# Patient Record
Sex: Female | Born: 2000 | Race: White | Hispanic: No | Marital: Single | Smoking: Never smoker
Health system: Southern US, Community
[De-identification: ages and names within clinical notes are randomized; demographics above are authoritative.]

## PROBLEM LIST (undated history)

## (undated) DIAGNOSIS — F909 Attention-deficit hyperactivity disorder, unspecified type: Secondary | ICD-10-CM

## (undated) DIAGNOSIS — J45909 Unspecified asthma, uncomplicated: Secondary | ICD-10-CM

## (undated) DIAGNOSIS — F32A Depression, unspecified: Secondary | ICD-10-CM

## (undated) DIAGNOSIS — F99 Mental disorder, not otherwise specified: Secondary | ICD-10-CM

## (undated) DIAGNOSIS — R519 Headache, unspecified: Secondary | ICD-10-CM

## (undated) HISTORY — DX: Depression, unspecified: F32.A

## (undated) HISTORY — DX: Attention-deficit hyperactivity disorder, unspecified type: F90.9

## (undated) HISTORY — DX: Headache, unspecified: R51.9

## (undated) HISTORY — PX: TONSILLECTOMY: SUR1361

## (undated) HISTORY — DX: Mental disorder, not otherwise specified: F99

## (undated) HISTORY — PX: ADENOIDECTOMY: SUR15

---
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
## 2018-01-01 MED FILL — TOPIRAMATE 50 MG TABLET: 50 | 30 days supply | Qty: 60 | Fill #0

## 2018-01-01 MED FILL — SULFAMETHOXAZOLE-TMP DS TAB: 800-160 | 5 days supply | Qty: 10 | Fill #0

## 2018-01-07 MED FILL — PROMETHAZINE 25 MG TABLET: 25 | 4 days supply | Qty: 20 | Fill #0

## 2018-01-08 MED FILL — DICYCLOMINE 10 MG CAPSULE: 10 | 14 days supply | Qty: 56 | Fill #0

## 2018-02-03 MED FILL — ADDERALL XR 30 MG CAP SA: 30 | 30 days supply | Qty: 30 | Fill #0

## 2018-02-03 MED FILL — TOPIRAMATE 50 MG TABLET: 50 | 30 days supply | Qty: 60 | Fill #1

## 2018-02-06 MED FILL — SUMATRIPTAN SUCC 100 MG TAB: 100 | 30 days supply | Qty: 9 | Fill #0

## 2018-02-20 IMAGING — DX DG ANKLE COMPLETE 3+V*R*
3 series · 3 of 3 positions shown · non-contrast
Comparison: None.

CLINICAL DATA: Pain after injury. Bilateral foot and ankle and left
lower leg pain after dropping a cooler onto both ankles.

EXAM:
RIGHT ANKLE - COMPLETE 3+ VIEW

[ankle ap]
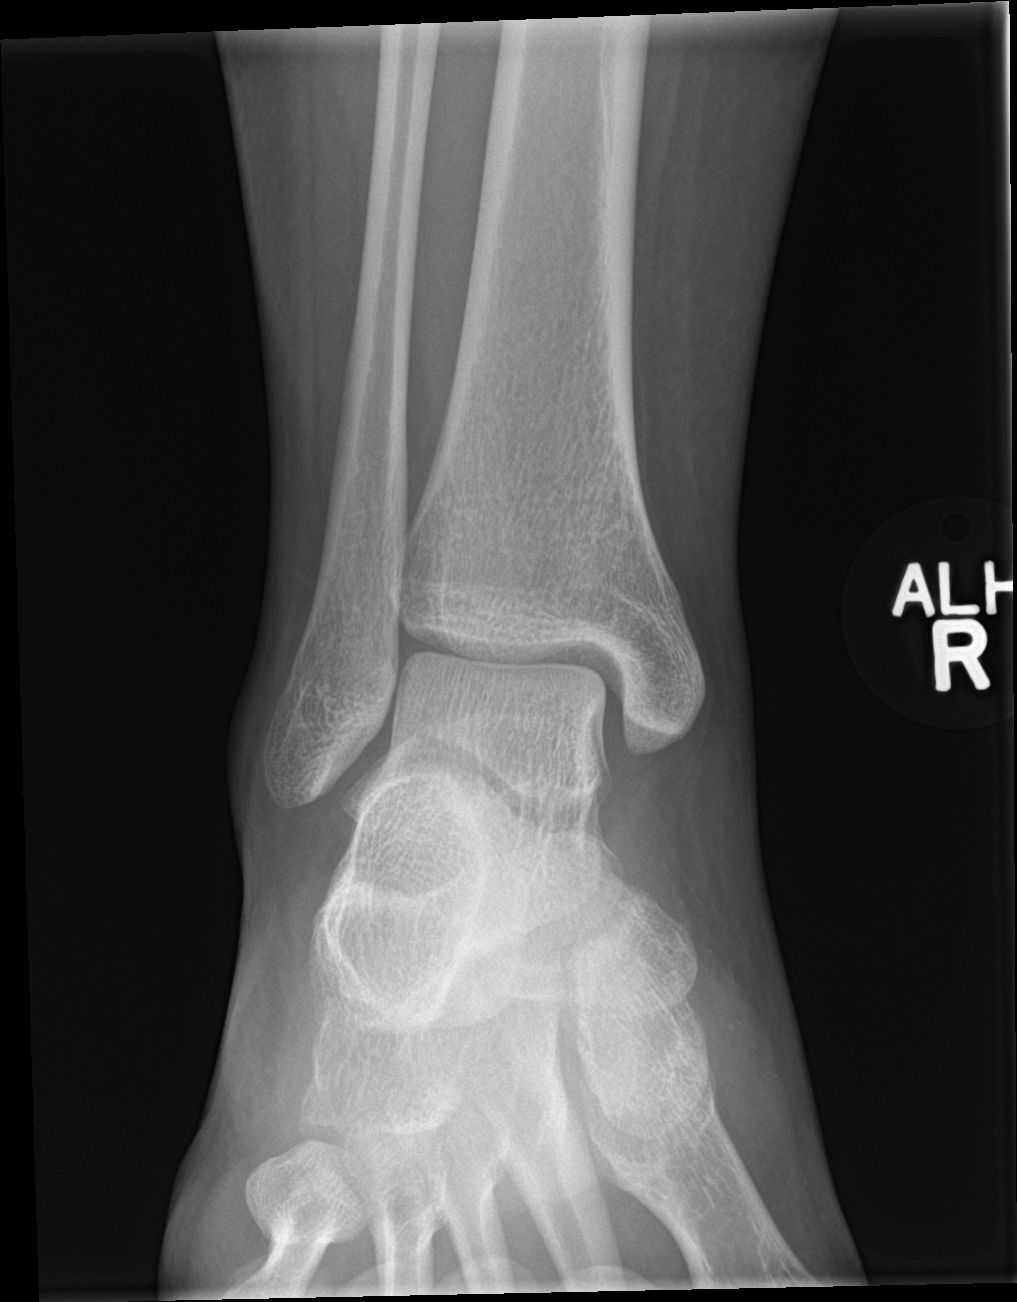

[ankle obl]
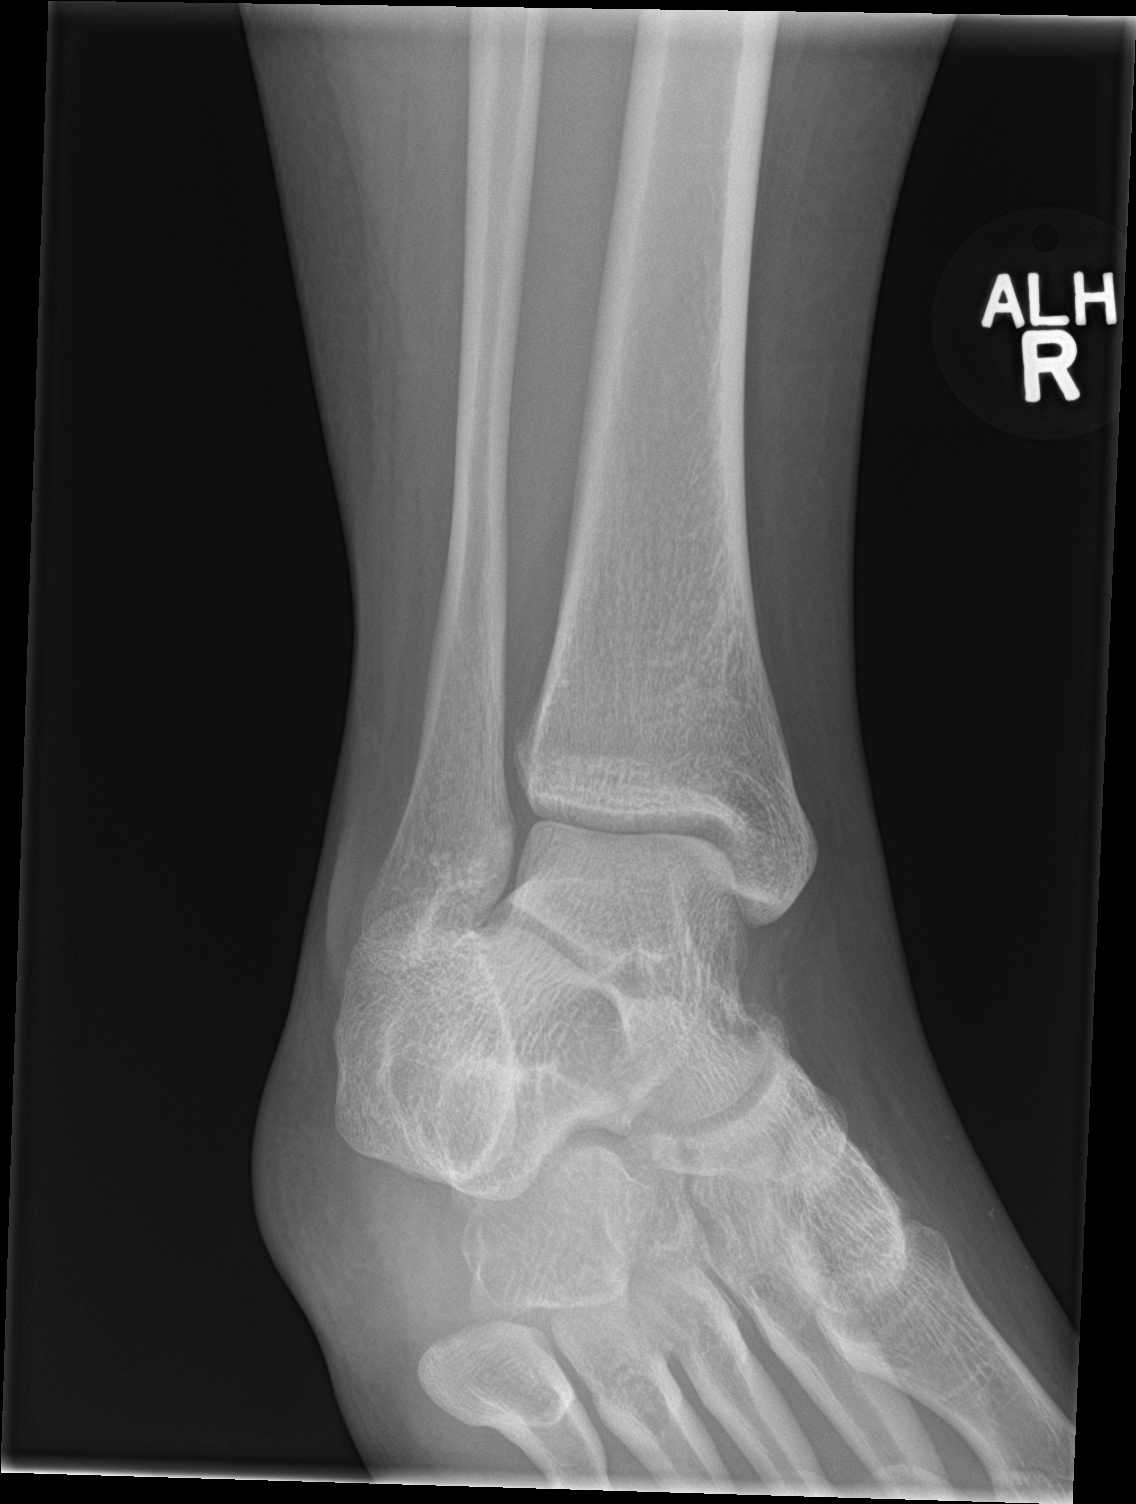

[ankle lat]
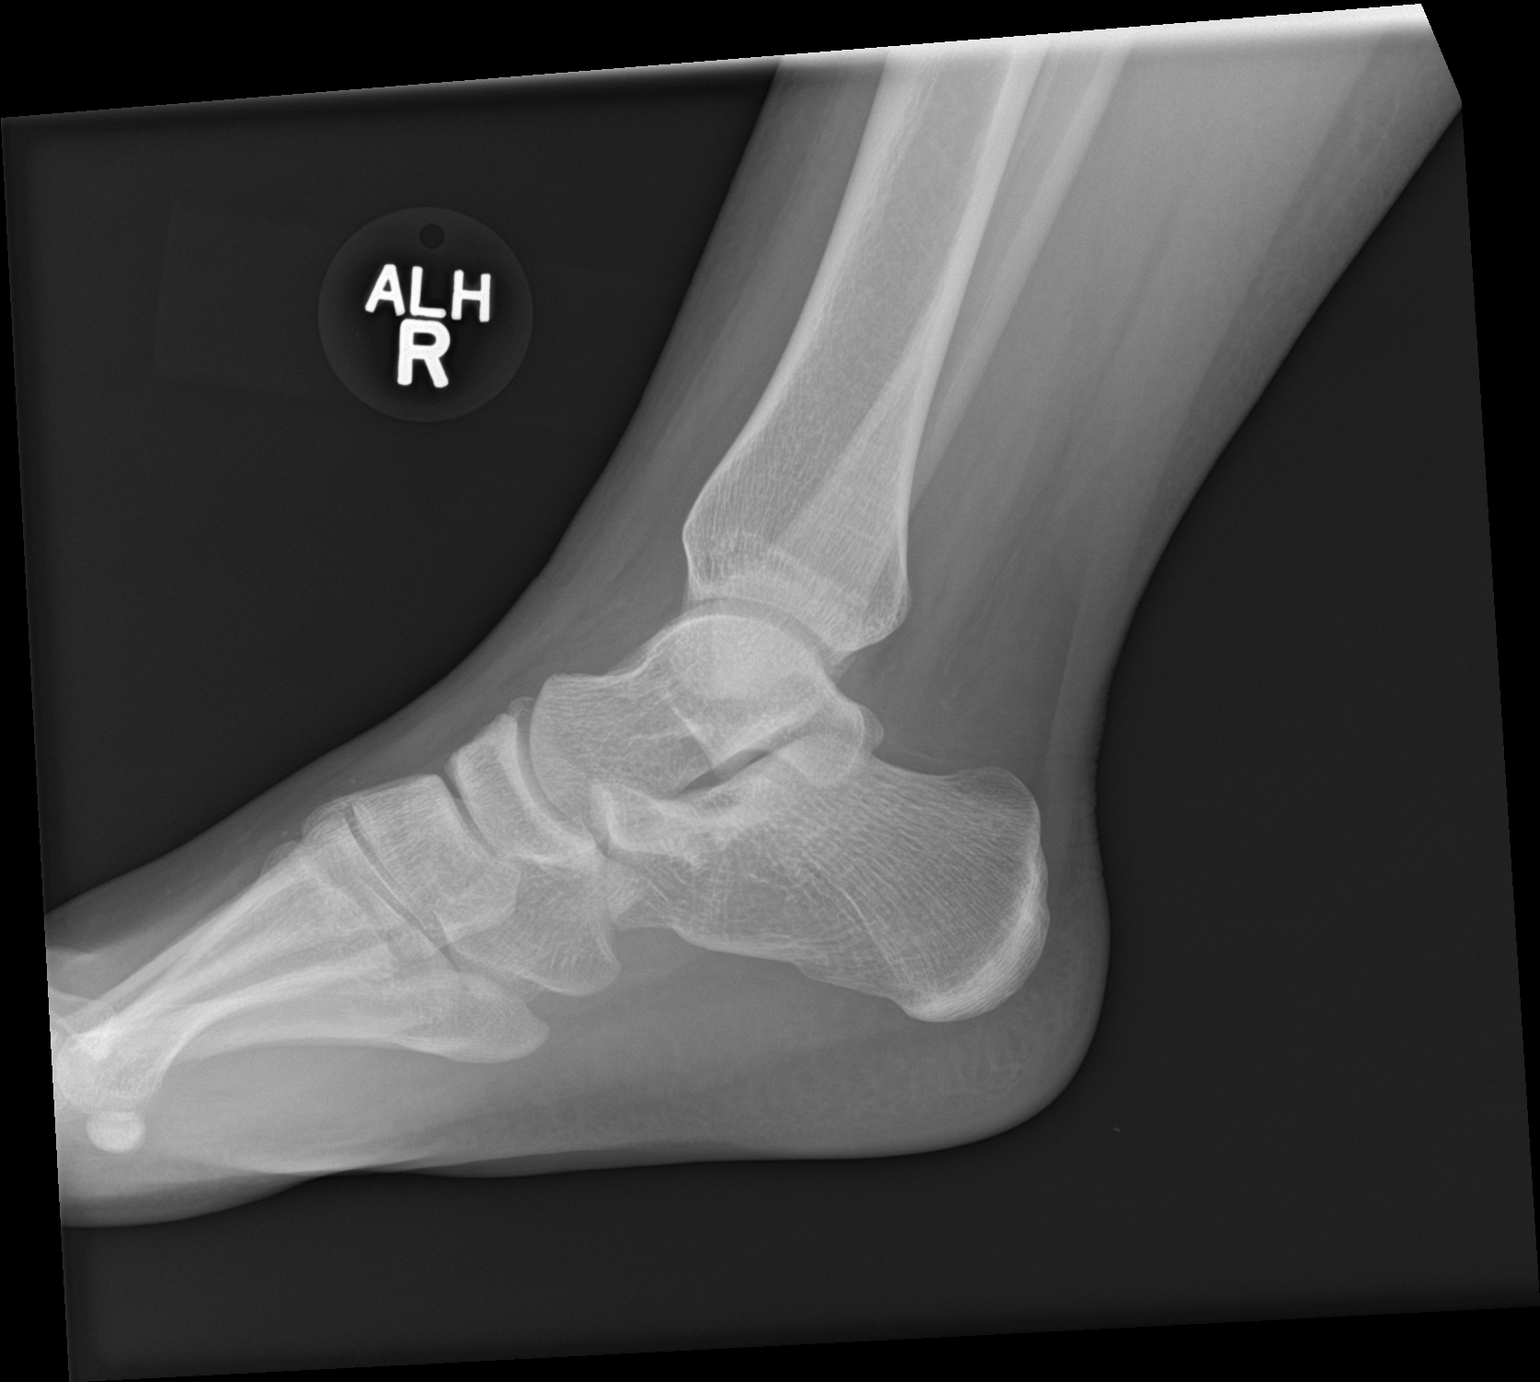

[3 of 3 positions shown; findings below may reference images not displayed]

FINDINGS: There is no evidence of fracture, dislocation, or joint effusion.
There is no evidence of arthropathy or other focal bone abnormality.
Soft tissues are unremarkable.
IMPRESSION: Negative radiographs of the right ankle.

## 2018-02-20 IMAGING — DX DG FOOT COMPLETE 3+V*L*
3 series · 3 of 3 positions shown · non-contrast
Comparison: None.

CLINICAL DATA: Pain after injury. Bilateral foot and ankle and left
lower leg pain after dropping a cooler onto both ankles.

EXAM:
LEFT FOOT - COMPLETE 3+ VIEW

[foot ap]
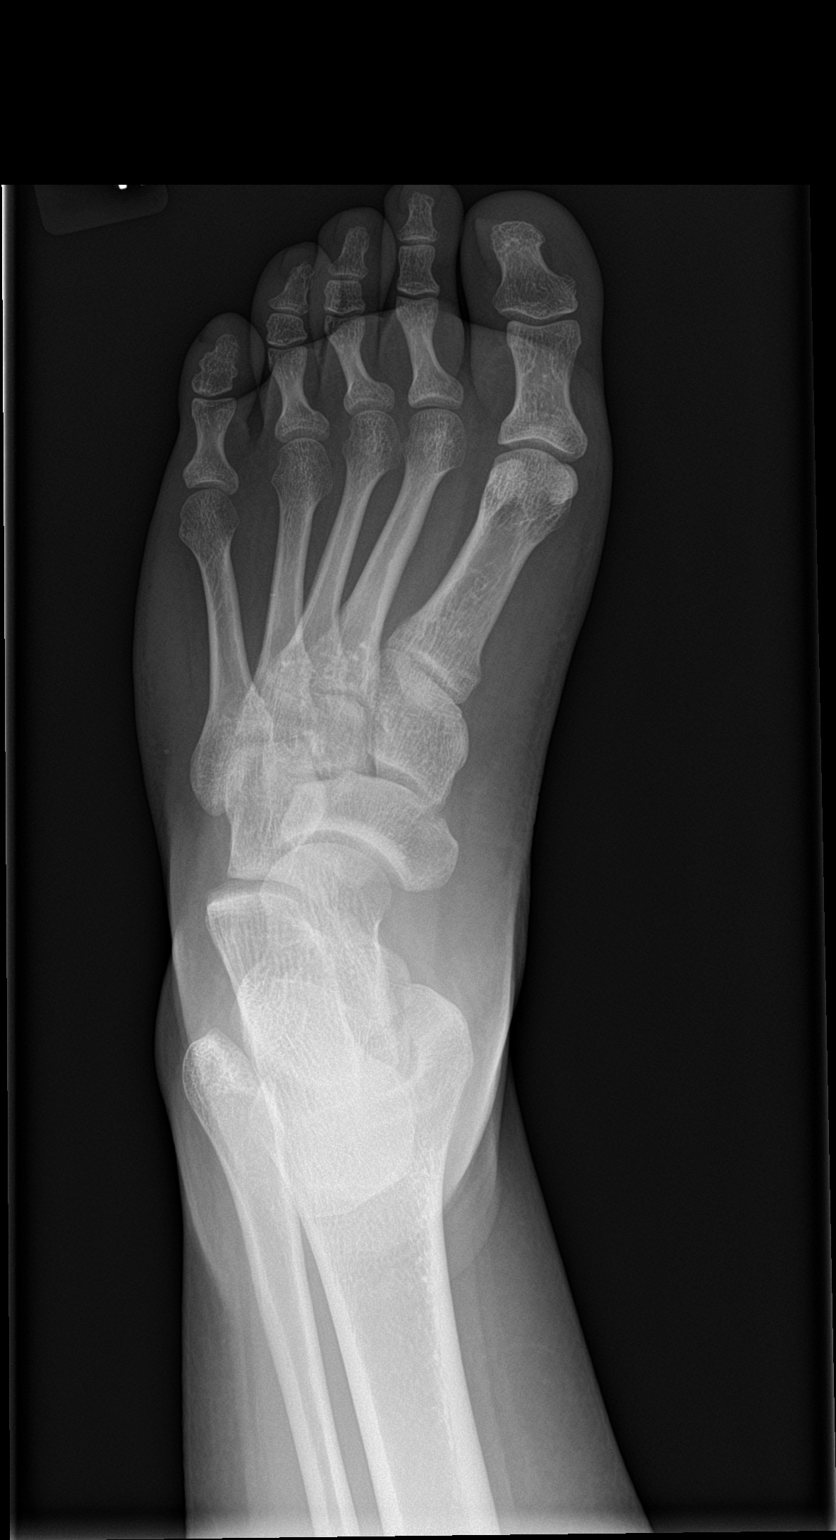

[foot obl]
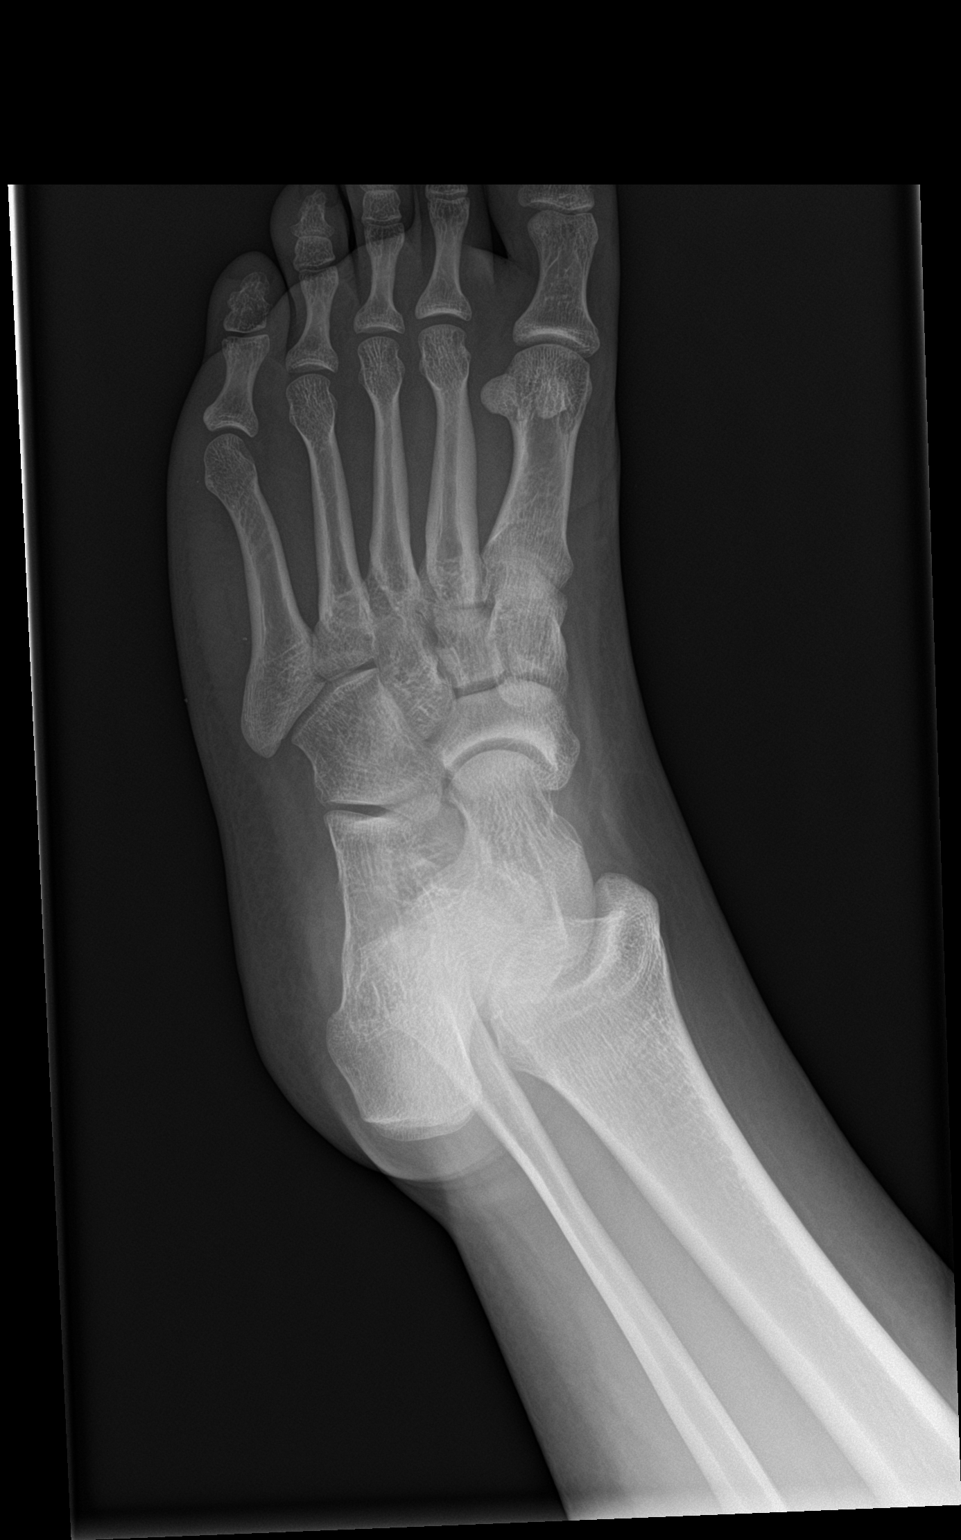

[foot lat]
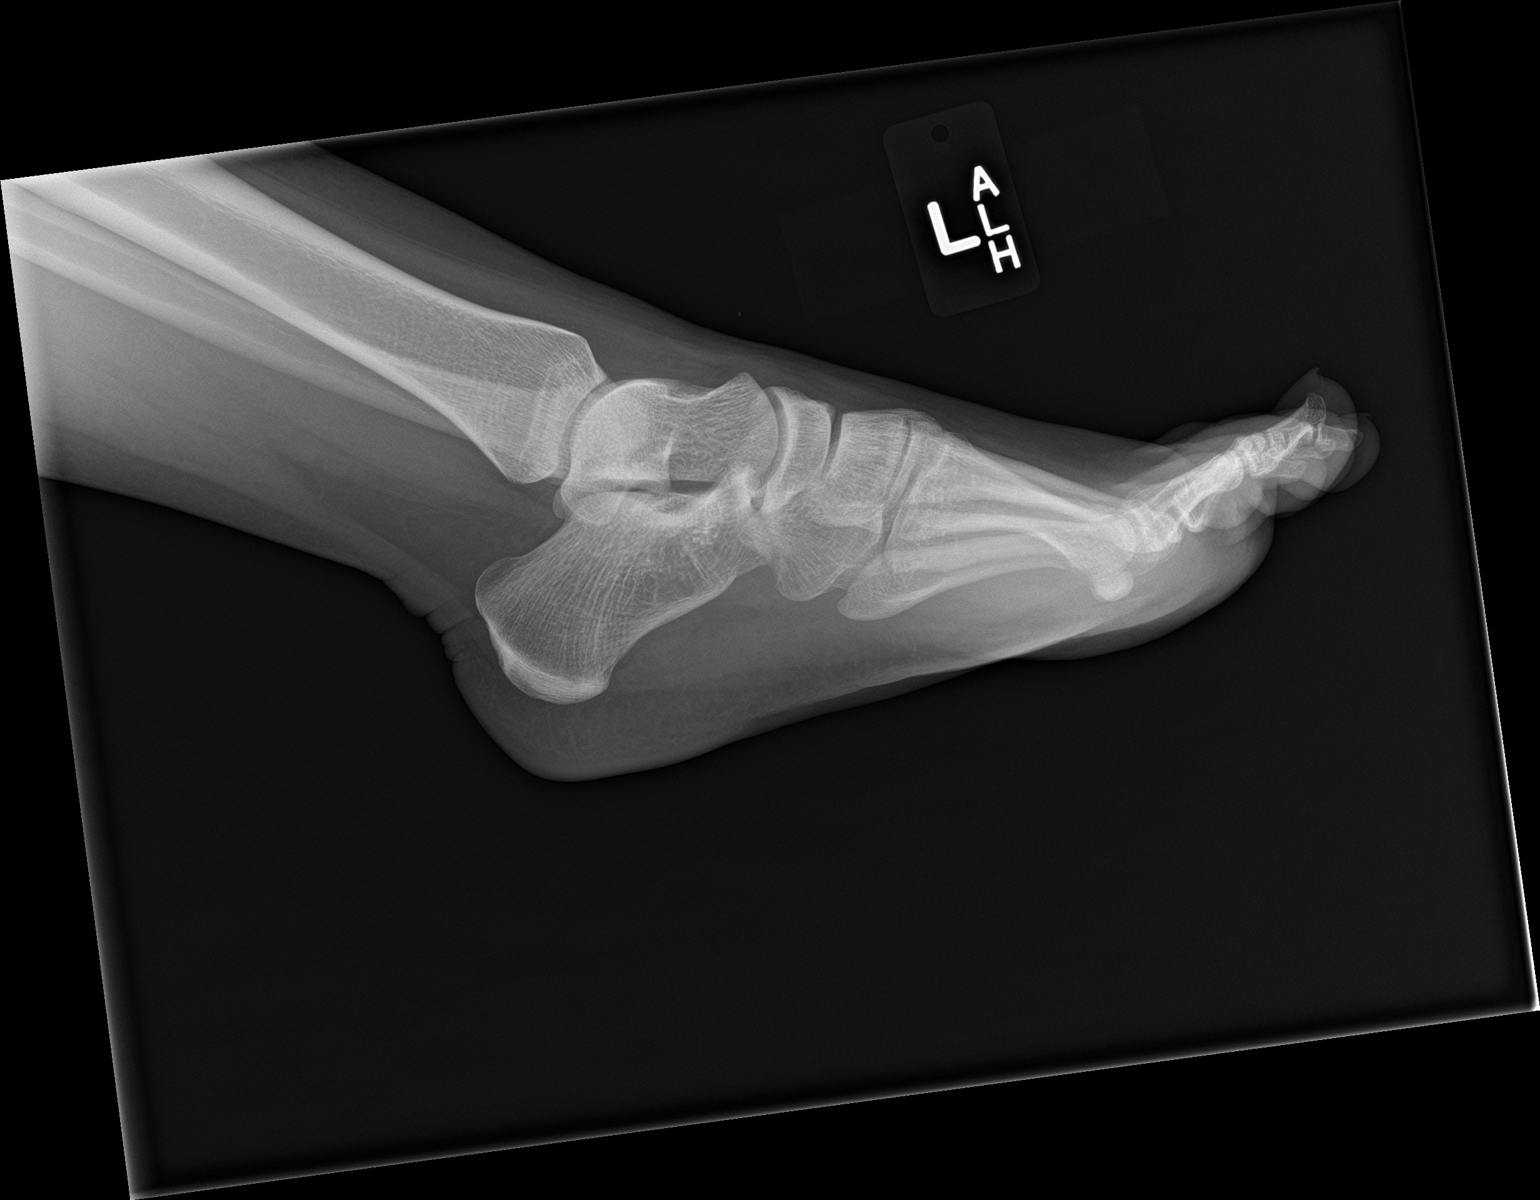

[3 of 3 positions shown; findings below may reference images not displayed]

FINDINGS: There is no evidence of fracture or dislocation. There is no
evidence of arthropathy or other focal bone abnormality. Medial soft
tissue edema.
IMPRESSION: Soft tissue edema.  No acute osseous abnormality.

## 2018-03-06 MED FILL — ADDERALL XR 30 MG CAP SA: 30 | 30 days supply | Qty: 30 | Fill #0

## 2018-03-06 MED FILL — SUMATRIPTAN SUCC 100 MG TAB: 100 | 30 days supply | Qty: 9 | Fill #1

## 2018-03-06 MED FILL — TOPIRAMATE 50 MG TABLET: 50 | 30 days supply | Qty: 60 | Fill #2

## 2018-04-15 MED FILL — ADDERALL XR 30 MG CAP SA: 30 | 30 days supply | Qty: 30 | Fill #0

## 2018-04-15 MED FILL — SUMAtriptan SUCCINATE 100 M: 100 | 30 days supply | Qty: 9 | Fill #2

## 2018-04-18 MED FILL — TOPIRAMATE 50 MG TABLET: 50 | 30 days supply | Qty: 60 | Fill #0

## 2018-05-27 DIAGNOSIS — A084 Viral intestinal infection, unspecified: Secondary | ICD-10-CM | POA: Diagnosis not present

## 2018-06-06 MED FILL — TOPIRAMATE 50 MG TABLET: 50 | 30 days supply | Qty: 60 | Fill #1

## 2018-06-09 MED FILL — SUMAtriptan SUCCINATE 100 M: 100 | 30 days supply | Qty: 9 | Fill #0

## 2018-06-09 MED FILL — ADDERALL XR 30 MG CAP SA: 30 | 30 days supply | Qty: 30 | Fill #0

## 2018-07-11 DIAGNOSIS — R350 Frequency of micturition: Secondary | ICD-10-CM | POA: Diagnosis not present

## 2018-07-11 DIAGNOSIS — R300 Dysuria: Secondary | ICD-10-CM | POA: Diagnosis not present

## 2018-07-11 DIAGNOSIS — N390 Urinary tract infection, site not specified: Secondary | ICD-10-CM | POA: Diagnosis not present

## 2018-07-23 MED FILL — ADDERALL XR 30 MG CAP SA: 30 | 30 days supply | Qty: 30 | Fill #0

## 2018-08-08 DIAGNOSIS — J069 Acute upper respiratory infection, unspecified: Secondary | ICD-10-CM | POA: Diagnosis not present

## 2018-08-08 DIAGNOSIS — J029 Acute pharyngitis, unspecified: Secondary | ICD-10-CM | POA: Diagnosis not present

## 2018-08-09 DIAGNOSIS — S0990XA Unspecified injury of head, initial encounter: Secondary | ICD-10-CM | POA: Diagnosis not present

## 2018-08-09 DIAGNOSIS — S99912A Unspecified injury of left ankle, initial encounter: Secondary | ICD-10-CM | POA: Diagnosis not present

## 2018-08-09 DIAGNOSIS — M25511 Pain in right shoulder: Secondary | ICD-10-CM | POA: Diagnosis not present

## 2018-08-09 DIAGNOSIS — S40011A Contusion of right shoulder, initial encounter: Secondary | ICD-10-CM | POA: Diagnosis not present

## 2018-08-11 DIAGNOSIS — F0781 Postconcussional syndrome: Secondary | ICD-10-CM | POA: Diagnosis not present

## 2018-08-11 DIAGNOSIS — H538 Other visual disturbances: Secondary | ICD-10-CM | POA: Diagnosis not present

## 2018-08-11 DIAGNOSIS — R42 Dizziness and giddiness: Secondary | ICD-10-CM | POA: Diagnosis not present

## 2018-08-11 DIAGNOSIS — R000 Tachycardia, unspecified: Secondary | ICD-10-CM | POA: Diagnosis not present

## 2018-08-11 DIAGNOSIS — S0990XA Unspecified injury of head, initial encounter: Secondary | ICD-10-CM | POA: Diagnosis not present

## 2018-08-11 DIAGNOSIS — N390 Urinary tract infection, site not specified: Secondary | ICD-10-CM | POA: Diagnosis not present

## 2018-08-11 MED FILL — BENZONATATE 200 MG CAPS: 200 | 10 days supply | Qty: 30 | Fill #0

## 2018-08-11 MED FILL — HYDROCODONE-HOMATROPINE SYR: 5-1.5 | 24 days supply | Qty: 120 | Fill #0

## 2018-08-12 DIAGNOSIS — F0781 Postconcussional syndrome: Secondary | ICD-10-CM | POA: Diagnosis not present

## 2018-08-12 DIAGNOSIS — R000 Tachycardia, unspecified: Secondary | ICD-10-CM | POA: Diagnosis not present

## 2018-08-12 DIAGNOSIS — H538 Other visual disturbances: Secondary | ICD-10-CM | POA: Diagnosis not present

## 2018-08-12 DIAGNOSIS — R42 Dizziness and giddiness: Secondary | ICD-10-CM | POA: Diagnosis not present

## 2018-09-24 NOTE — L&D Delivery Note (Signed)
Delivery Note At 9:37 PM a viable female was delivered via Vaginal, Spontaneous (Presentation:  Left Occiput Anterior); loose nuchal x 1 reduced.  APGAR: , ; weight pending.   Placenta status: Spontaneous, Intact.  Cord: 3 vessels with the following complications: None.  Cord pH: n/a  Anesthesia: Epidural Episiotomy: Median Lacerations: 2nd degree Suture Repair: 3.0 vicryl rapide Est. Blood Loss (mL): 350  Mom to postpartum.  Baby to Couplet care / Skin to Skin.  Linda Hedges 09/02/2019, 9:59 PM

## 2018-10-02 MED FILL — TOPIRAMATE 50 MG TABLET: 50 | 30 days supply | Qty: 60 | Fill #2

## 2018-10-02 MED FILL — SUMAtriptan SUCCINATE 100 M: 100 | 30 days supply | Qty: 9 | Fill #1

## 2018-11-10 MED FILL — FLUTICASONE PROP 50 MCG SPR: 50 | 30 days supply | Qty: 16 | Fill #0

## 2018-12-09 MED FILL — TOPIRAMATE 50 MG TABLET: 50 | 30 days supply | Qty: 60 | Fill #0

## 2018-12-09 MED FILL — SUMAtriptan SUCCINATE 100 M: 100 | 30 days supply | Qty: 9 | Fill #2

## 2018-12-09 MED FILL — ADDERALL XR 30 MG CAP SA: 30 | 30 days supply | Qty: 30 | Fill #0

## 2018-12-29 MED FILL — predniSONE 20 MG TABS: 20 | 7 days supply | Qty: 10 | Fill #0

## 2019-03-03 MED FILL — PNV-SELECT TABLET: 27-0.6-0.4 | 30 days supply | Qty: 30 | Fill #0

## 2019-03-05 MED FILL — DOXYLAMINE-PYRIDOXINE 10-10: 10-10 | 15 days supply | Qty: 30 | Fill #0

## 2019-03-11 LAB — OB RESULTS CONSOLE GC/CHLAMYDIA
Chlamydia: NEGATIVE
Gonorrhea: NEGATIVE

## 2019-03-11 LAB — OB RESULTS CONSOLE RPR: RPR: NONREACTIVE

## 2019-03-11 LAB — OB RESULTS CONSOLE HIV ANTIBODY (ROUTINE TESTING): HIV: NONREACTIVE

## 2019-03-11 LAB — OB RESULTS CONSOLE HEPATITIS B SURFACE ANTIGEN: Hepatitis B Surface Ag: NEGATIVE

## 2019-03-11 LAB — OB RESULTS CONSOLE RUBELLA ANTIBODY, IGM: Rubella: IMMUNE

## 2019-03-11 LAB — OB RESULTS CONSOLE ANTIBODY SCREEN: Antibody Screen: NEGATIVE

## 2019-03-11 LAB — OB RESULTS CONSOLE ABO/RH: RH Type: NEGATIVE

## 2019-07-31 ENCOUNTER — Inpatient Hospital Stay (HOSPITAL_COMMUNITY)
Admission: AD | Admit: 2019-07-31 | Discharge: 2019-07-31 | Disposition: A | Discharge status: Home / Self Care | Payer: No Typology Code available for payment source | Attending: Obstetrics and Gynecology | Admitting: Obstetrics and Gynecology

## 2019-07-31 ENCOUNTER — Encounter (HOSPITAL_COMMUNITY): Payer: Self-pay | Admitting: *Deleted

## 2019-07-31 ENCOUNTER — Other Ambulatory Visit: Payer: Self-pay

## 2019-07-31 DIAGNOSIS — O26893 Other specified pregnancy related conditions, third trimester: Secondary | ICD-10-CM | POA: Insufficient documentation

## 2019-07-31 DIAGNOSIS — Z3A34 34 weeks gestation of pregnancy: Secondary | ICD-10-CM | POA: Diagnosis not present

## 2019-07-31 DIAGNOSIS — R102 Pelvic and perineal pain: Secondary | ICD-10-CM | POA: Insufficient documentation

## 2019-07-31 DIAGNOSIS — Z3689 Encounter for other specified antenatal screening: Secondary | ICD-10-CM

## 2019-07-31 DIAGNOSIS — O99891 Other specified diseases and conditions complicating pregnancy: Secondary | ICD-10-CM | POA: Diagnosis not present

## 2019-07-31 DIAGNOSIS — M549 Dorsalgia, unspecified: Secondary | ICD-10-CM | POA: Diagnosis not present

## 2019-07-31 LAB — URINALYSIS, ROUTINE W REFLEX MICROSCOPIC
Bilirubin Urine: NEGATIVE
Glucose, UA: NEGATIVE mg/dL
Hgb urine dipstick: NEGATIVE
Ketones, ur: NEGATIVE mg/dL
Leukocytes,Ua: NEGATIVE
Nitrite: NEGATIVE
Protein, ur: NEGATIVE mg/dL
Specific Gravity, Urine: 1.011 (ref 1.005–1.030)
pH: 6 (ref 5.0–8.0)

## 2019-07-31 LAB — WET PREP, GENITAL
Clue Cells Wet Prep HPF POC: NONE SEEN
Sperm: NONE SEEN
Trich, Wet Prep: NONE SEEN
Yeast Wet Prep HPF POC: NONE SEEN

## 2019-07-31 MED ORDER — CYCLOBENZAPRINE HCL 10 MG PO TABS: 10.0000 mg | ORAL_TABLET | Freq: Two times a day (BID) | ORAL | 0 refills | Status: DC | PRN

## 2019-07-31 MED ORDER — CYCLOBENZAPRINE HCL 10 MG PO TABS
10.0000 mg | ORAL_TABLET | Freq: Once | ORAL | Status: AC
  Administered 2019-07-31: 10 mg via ORAL
  Filled 2019-07-31: qty 1

## 2019-07-31 NOTE — MAU Provider Note (Signed)
History     CSN: [id number]  Arrival date and time: 07/31/19 1042   First Provider Initiated Contact with Patient 07/31/19 1133      Chief Complaint  Patient presents with  . Back Pain  . Pelvic Pain   Destiny Harris is a 18 y.o. G1P0 at [redacted]w[redacted]d who receives care at [facility].  She presents today for Back Pain and Pelvic Pain.  She states the back pain has been ongoing for a few days, but the pelvic pain started late last night and has got progressively worse.  She states that she has taken tylenol last night with some relief of her symptoms.  Patient rates the pain a 10/10 and is constant sharp pain and pressure.  She reports some vaginal bleeding that she noticed this morning.  She states it was a brownish red color and was noted with wiping.  She endorses fetal movement and denies LOF, but goes on to state that she felt "a little wet this morning" and was unsure of whether "I peed on myself or if it was something else."      OB History    Gravida  1   Para      Term      Preterm      AB      Living        SAB      TAB      Ectopic      Multiple      Live Births              Past Medical History:  Diagnosis Date  . Asthma     Past Surgical History:  Procedure Laterality Date  . ADENOIDECTOMY    . TONSILLECTOMY      History reviewed. No pertinent family history.  Social History   Tobacco Use  . Smoking status: Never Smoker  . Smokeless tobacco: Never Used  Substance Use Topics  . Alcohol use: No  . Drug use: No    Allergies: No Known Allergies  Medications Prior to Admission  Medication Sig Dispense Refill Last Dose  . Prenatal Vit-Fe Fumarate-FA (PRENATAL MULTIVITAMIN) TABS tablet Take 1 tablet by mouth daily at 12 noon.   07/31/2019 at Unknown time  . albuterol (PROAIR HFA) 108 (90 BASE) MCG/ACT inhaler Inhale 2 puffs into the lungs every 6 (six) hours as needed for wheezing or shortness of breath.     Marland Kitchen albuterol (PROVENTIL  HFA;VENTOLIN HFA) 108 (90 Base) MCG/ACT inhaler Inhale 2 puffs into the lungs every 6 (six) hours as needed for wheezing or shortness of breath.     Marland Kitchen albuterol (PROVENTIL) (2.5 MG/3ML) 0.083% nebulizer solution Take 2.5 mg by nebulization every 6 (six) hours as needed for wheezing or shortness of breath.     . amphetamine-dextroamphetamine (ADDERALL XR) 15 MG 24 hr capsule Take 15 mg by mouth every morning.     Marland Kitchen azelastine (ASTELIN) 0.1 % nasal spray Place 1 spray into both nostrils 2 (two) times daily. Use in each nostril as directed     . benzonatate (TESSALON) 200 MG capsule Take 200 mg by mouth 3 (three) times daily as needed for cough.     . cetirizine (ZYRTEC) 10 MG tablet Take 10 mg by mouth daily.     Marland Kitchen guaiFENesin-codeine (ROBITUSSIN AC) 100-10 MG/5ML syrup Take 5 mLs by mouth 3 (three) times daily as needed for cough.     Marland Kitchen omeprazole (PRILOSEC) 20 MG capsule  Take 2 capsules (40 mg total) by mouth 2 (two) times daily before a meal. 120 capsule 5   . predniSONE (STERAPRED UNI-PAK 21 TAB) 5 MG (21) TBPK tablet Take 5 mg by mouth daily.     . ranitidine (ZANTAC) 150 MG capsule Take 2 capsules (300 mg total) by mouth every evening. 60 capsule 5   . Respiratory Therapy Supplies (FLUTTER) DEVI 1 Device by Does not apply route as needed. 1 each 0   . sodium chloride (OCEAN) 0.65 % SOLN nasal spray Place 1 spray into both nostrils as needed for congestion.     . traMADol (ULTRAM) 50 MG tablet 1-2 every 4 hours as needed for cough or pain 20 tablet 0     Review of Systems  Constitutional: Negative for chills and fever.  Respiratory: Negative for cough and shortness of breath.   Gastrointestinal: Negative for constipation, diarrhea, nausea and vomiting.  Genitourinary: Positive for pelvic pain, vaginal bleeding (Brownish Reddish) and vaginal discharge. Negative for difficulty urinating and dysuria.  Musculoskeletal: Positive for back pain.  Neurological: Negative for dizziness,  light-headedness and headaches.   Physical Exam   Blood pressure (!) 142/70, pulse (!) 107, temperature 98 F (36.7 C), resp. rate 16, last menstrual period 12/03/2018, SpO2 100 %.  Vitals:   07/31/19 1148 07/31/19 1150 07/31/19 1155 07/31/19 1321  BP: 137/71   128/71  Pulse: 98   90  Resp:    16  Temp:      SpO2:  99% 98%     Physical Exam  Constitutional: She is oriented to person, place, and time.  HENT:  Head: Normocephalic and atraumatic.  Eyes: Conjunctivae are normal.  Neck: Normal range of motion.  Cardiovascular: Normal rate, regular rhythm and normal heart sounds.  Respiratory: Effort normal and breath sounds normal.  GI: Soft. Bowel sounds are normal. There is abdominal tenderness. There is no CVA tenderness.  Genitourinary:    Genitourinary Comments: Speculum Exam: -Normal External Genitalia: Non tender, no apparent discharge at introitus.  -Vaginal Vault: Pink mucosa with good rugae. Small amt thin white discharge -wet prep collected -Cervix:Pink, no lesions, cysts, or polyps.  Appears closed. No active bleeding from os-GC/CT collected -Bimanual Exam: Dilation: Closed Exam by:: Milinda Cave CNM    Musculoskeletal: Normal range of motion.  Neurological: She is alert and oriented to person, place, and time.  Skin: Skin is warm and dry.  Psychiatric: She has a normal mood and affect. Her behavior is normal.    Fetal Assessment 135 bpm, Mod Var, -Decels, +Accels Toco: Irritability  MAU Course   Results for orders placed or performed during the hospital encounter of 07/31/19 (from the past 24 hour(s))  Urinalysis, Routine w reflex microscopic     Status: Abnormal   Collection Time: 07/31/19 11:08 AM  Result Value Ref Range   Color, Urine YELLOW YELLOW   APPearance HAZY (A) CLEAR   Specific Gravity, Urine 1.011 1.005 - 1.030   pH 6.0 5.0 - 8.0   Glucose, UA NEGATIVE NEGATIVE mg/dL   Hgb urine dipstick NEGATIVE NEGATIVE   Bilirubin Urine NEGATIVE NEGATIVE    Ketones, ur NEGATIVE NEGATIVE mg/dL   Protein, ur NEGATIVE NEGATIVE mg/dL   Nitrite NEGATIVE NEGATIVE   Leukocytes,Ua NEGATIVE NEGATIVE  Wet prep, genital     Status: Abnormal   Collection Time: 07/31/19 11:46 AM   Specimen: PATH Cytology Cervicovaginal Ancillary Only  Result Value Ref Range   Yeast Wet Prep HPF POC NONE SEEN NONE SEEN  Trich, Wet Prep NONE SEEN NONE SEEN   Clue Cells Wet Prep HPF POC NONE SEEN NONE SEEN   WBC, Wet Prep HPF POC MANY (A) NONE SEEN   Sperm NONE SEEN    No results found.  MDM PE Labs: GC/CT and Wet Prep EFM Pain Mgmt Assessment and Plan  18 year old G1P0  SIUP at 34.2weeks Cat I FT Back Pain Pelvic Pain  -POC discussed -Exam performed and findings discussed. -Cultures collected and pending.  -Offered and accepts pain medication. -Will give flexeril 10mg  now and reassess.   Cherre RobinsJessica L Neamiah Sciarra MSN, CNM 07/31/2019, 11:34 AM    Reassessment (12:59 PM)  -Wet prep returns with insignificant findings. -Results discussed with patient. -Informed that GC/CT will return within 2-3 days. -Patient reports some relief in pain s/p flexeril. -Patient states pain is further relieved with position change. -Patient denies increased blood pressures in office. -Informed of elevated bp x1 and will defer work up since no prior history.  Discussed how primary ob may complete full preelampsia work up if additional elevations noted.  -Keep appt as scheduled for Monday. -Patient reports that she does not have a belly support belt, but wears belly bands in her pants. -Patient encouraged to obtain belly support band for work. -Discussed usage of warm compresses to back and pelvis. -Instructed to follow up with primary ob as scheduled. -Will take OOW until next ob appt on Monday November 9th -No other questions or concerns. -Encouraged to call or return to MAU if symptoms worsen or with the onset of new symptoms. -Discharged to home in stable condition.  Cherre RobinsJessica  L Devanta Daniel MSN, CNM Advanced Practice Provider, [facility]

## 2019-07-31 NOTE — MAU Note (Signed)
.   Destiny Harris is a 18 y.o. at [redacted]w[redacted]d here in MAU reporting: lower back pain that is ongoing, with pelvic pain that started last night. Brownish red discharge when she wiped this morning  Onset of complaint: ongoing Pain score: 10/10 Vitals:   07/31/19 1105  BP: (!) 142/70  Pulse: (!) 107  Resp: 16  Temp: 98 F (36.7 C)  SpO2: 100%     FHT:148 Lab orders placed from triage: [facility]

## 2019-07-31 NOTE — Discharge Instructions (Signed)
Abdominal Pain During Pregnancy  Abdominal pain is common during pregnancy, and has many possible causes. Some causes are more serious than others, and sometimes the cause is not known. Abdominal pain can be a sign that labor is starting. It can also be caused by normal growth and stretching of muscles and ligaments during pregnancy. Always tell your health care provider if you have any abdominal pain. Follow these instructions at home:  Do not have sex or put anything in your vagina until your pain goes away completely.  Get plenty of rest until your pain improves.  Drink enough fluid to keep your urine pale yellow.  Take over-the-counter and prescription medicines only as told by your health care provider.  Keep all follow-up visits as told by your health care provider. This is important. Contact a health care provider if:  Your pain continues or gets worse after resting.  You have lower abdominal pain that: ? Comes and goes at regular intervals. ? Spreads to your back. ? Is similar to menstrual cramps.  You have pain or burning when you urinate. Get help right away if:  You have a fever or chills.  You have vaginal bleeding.  You are leaking fluid from your vagina.  You are passing tissue from your vagina.  You have vomiting or diarrhea that lasts for more than 24 hours.  Your baby is moving less than usual.  You feel very weak or faint.  You have shortness of breath.  You develop severe pain in your upper abdomen. Summary  Abdominal pain is common during pregnancy, and has many possible causes.  If you experience abdominal pain during pregnancy, tell your health care provider right away.  Follow your health care provider's home care instructions and keep all follow-up visits as directed. This information is not intended to replace advice given to you by your health care provider. Make sure you discuss any questions you have with your health care  provider. Document Released: 09/10/2005 Document Revised: 12/29/2018 Document Reviewed: 12/13/2016 Elsevier Patient Education  2020 [employer] Back Pain in Pregnancy Back pain during pregnancy is common. Back pain may be caused by several factors that are related to changes during your pregnancy. Follow these instructions at home: Managing pain, stiffness, and swelling      If directed, for sudden (acute) back pain, put ice on the painful area. ? Put ice in a plastic bag. ? Place a towel between your skin and the bag. ? Leave the ice on for 20 minutes, 2-3 times per day.  If directed, apply heat to the affected area before you exercise. Use the heat source that your health care provider recommends, such as a moist heat pack or a heating pad. ? Place a towel between your skin and the heat source. ? Leave the heat on for 20-30 minutes. ? Remove the heat if your skin turns bright red. This is especially important if you are unable to feel pain, heat, or cold. You may have a greater risk of getting burned.  If directed, massage the affected area. Activity  Exercise as told by your health care provider. Gentle exercise is the best way to prevent or manage back pain.  Listen to your body when lifting. If lifting hurts, ask for help or bend your knees. This uses your leg muscles instead of your back muscles.  Squat down when picking up something from the floor. Do not bend over.  Only use bed rest for short periods as  told by your health care provider. Bed rest should only be used for the most severe episodes of back pain. Standing, sitting, and lying down  Do not stand in one place for long periods of time.  Use good posture when sitting. Make sure your head rests over your shoulders and is not hanging forward. Use a pillow on your lower back if necessary.  Try sleeping on your side, preferably the left side, with a pregnancy support pillow or 1-2 regular pillows between your  legs. ? If you have back pain after a night's rest, your bed may be too soft. ? A firm mattress may provide more support for your back during pregnancy. General instructions  Do not wear high heels.  Eat a healthy diet. Try to gain weight within your health care provider's recommendations.  Use a maternity girdle, elastic sling, or back brace as told by your health care provider.  Take over-the-counter and prescription medicines only as told by your health care provider.  Work with a physical therapist or massage therapist to find ways to manage back pain. Acupuncture or massage therapy may be helpful.  Keep all follow-up visits as told by your health care provider. This is important. Contact a health care provider if:  Your back pain interferes with your daily activities.  You have increasing pain in other parts of your body. Get help right away if:  You develop numbness, tingling, weakness, or problems with the use of your arms or legs.  You develop severe back pain that is not controlled with medicine.  You have a change in bowel or bladder control.  You develop shortness of breath, dizziness, or you faint.  You develop nausea, vomiting, or sweating.  You have back pain that is a rhythmic, cramping pain similar to labor pains. Labor pain is usually 1-2 minutes apart, lasts for about 1 minute, and involves a bearing down feeling or pressure in your pelvis.  You have back pain and your water breaks or you have vaginal bleeding.  You have back pain or numbness that travels down your leg.  Your back pain developed after you fell.  You develop pain on one side of your back.  You see blood in your urine.  You develop skin blisters in the area of your back pain. Summary  Back pain may be caused by several factors that are related to changes during your pregnancy.  Follow instructions as told by your health care provider for managing pain, stiffness, and  swelling.  Exercise as told by your health care provider. Gentle exercise is the best way to prevent or manage back pain.  Take over-the-counter and prescription medicines only as told by your health care provider.  Keep all follow-up visits as told by your health care provider. This is important. This information is not intended to replace advice given to you by your health care provider. Make sure you discuss any questions you have with your health care provider. Document Released: 12/19/2005 Document Revised: 12/30/2018 Document Reviewed: 02/26/2018 Elsevier Patient Education  2020 [employer].

## 2019-08-03 LAB — GC/CHLAMYDIA PROBE AMP (~~LOC~~) NOT AT ARMC
Chlamydia: NEGATIVE
Comment: NEGATIVE
Comment: NORMAL
Neisseria Gonorrhea: NEGATIVE

## 2019-08-07 LAB — OB RESULTS CONSOLE GBS: GBS: NEGATIVE

## 2019-08-17 ENCOUNTER — Telehealth (HOSPITAL_COMMUNITY): Payer: Self-pay | Admitting: *Deleted

## 2019-08-17 ENCOUNTER — Encounter (HOSPITAL_COMMUNITY): Payer: Self-pay | Admitting: *Deleted

## 2019-08-17 NOTE — Telephone Encounter (Signed)
Preadmission screen  

## 2019-08-23 ENCOUNTER — Other Ambulatory Visit: Payer: Self-pay

## 2019-08-23 ENCOUNTER — Inpatient Hospital Stay (HOSPITAL_COMMUNITY)
Admission: AD | Admit: 2019-08-23 | Discharge: 2019-08-23 | Disposition: A | Discharge status: Home / Self Care | Payer: No Typology Code available for payment source | Attending: Obstetrics and Gynecology | Admitting: Obstetrics and Gynecology

## 2019-08-23 ENCOUNTER — Encounter (HOSPITAL_COMMUNITY): Payer: Self-pay | Admitting: *Deleted

## 2019-08-23 DIAGNOSIS — O471 False labor at or after 37 completed weeks of gestation: Secondary | ICD-10-CM | POA: Diagnosis present

## 2019-08-23 DIAGNOSIS — O479 False labor, unspecified: Secondary | ICD-10-CM

## 2019-08-23 NOTE — Discharge Instructions (Signed)
Braxton Hicks Contractions Contractions of the uterus can occur throughout pregnancy, but they are not always a sign that you are in labor. You may have practice contractions called Braxton Hicks contractions. These false labor contractions are sometimes confused with true labor. What are Braxton Hicks contractions? Braxton Hicks contractions are tightening movements that occur in the muscles of the uterus before labor. Unlike true labor contractions, these contractions do not result in opening (dilation) and thinning of the cervix. Toward the end of pregnancy (32-34 weeks), Braxton Hicks contractions can happen more often and may become stronger. These contractions are sometimes difficult to tell apart from true labor because they can be very uncomfortable. You should not feel embarrassed if you go to the hospital with false labor. Sometimes, the only way to tell if you are in true labor is for your health care provider to look for changes in the cervix. The health care provider will do a physical exam and may monitor your contractions. If you are not in true labor, the exam should show that your cervix is not dilating and your water has not broken. If there are no other health problems associated with your pregnancy, it is completely safe for you to be sent home with false labor. You may continue to have Braxton Hicks contractions until you go into true labor. How to tell the difference between true labor and false labor True labor  Contractions last 30-70 seconds.  Contractions become very regular.  Discomfort is usually felt in the top of the uterus, and it spreads to the lower abdomen and low back.  Contractions do not go away with walking.  Contractions usually become more intense and increase in frequency.  The cervix dilates and gets thinner. False labor  Contractions are usually shorter and not as strong as true labor contractions.  Contractions are usually irregular.  Contractions  are often felt in the front of the lower abdomen and in the groin.  Contractions may go away when you walk around or change positions while lying down.  Contractions get weaker and are shorter-lasting as time goes on.  The cervix usually does not dilate or become thin. Follow these instructions at home:   Take over-the-counter and prescription medicines only as told by your health care provider.  Keep up with your usual exercises and follow other instructions from your health care provider.  Eat and drink lightly if you think you are going into labor.  If Braxton Hicks contractions are making you uncomfortable: ? Change your position from lying down or resting to walking, or change from walking to resting. ? Sit and rest in a tub of warm water. ? Drink enough fluid to keep your urine pale yellow. Dehydration may cause these contractions. ? Do slow and deep breathing several times an hour.  Keep all follow-up prenatal visits as told by your health care provider. This is important. Contact a health care provider if:  You have a fever.  You have continuous pain in your abdomen. Get help right away if:  Your contractions become stronger, more regular, and closer together.  You have fluid leaking or gushing from your vagina.  You pass blood-tinged mucus (bloody show).  You have bleeding from your vagina.  You have low back pain that you never had before.  You feel your baby's head pushing down and causing pelvic pressure.  Your baby is not moving inside you as much as it used to. Summary  Contractions that occur before labor are   called Braxton Hicks contractions, false labor, or practice contractions.  Braxton Hicks contractions are usually shorter, weaker, farther apart, and less regular than true labor contractions. True labor contractions usually become progressively stronger and regular, and they become more frequent.  Manage discomfort from Braxton Hicks contractions  by changing position, resting in a warm bath, drinking plenty of water, or practicing deep breathing. This information is not intended to replace advice given to you by your health care provider. Make sure you discuss any questions you have with your health care provider. Document Released: 01/24/2017 Document Revised: 08/23/2017 Document Reviewed: 01/24/2017 Elsevier Patient Education  2020 Else[employer] Inc.  

## 2019-08-23 NOTE — MAU Note (Signed)
Discharge instructions reviewed with patient. Patient received discharge papers. Patient left without signing.

## 2019-08-23 NOTE — MAU Note (Signed)
Pt reports to MAU c/o ctx every 5-10 min. No bleeding or LOF. +FM.

## 2019-08-31 ENCOUNTER — Other Ambulatory Visit (HOSPITAL_COMMUNITY)
Admission: RE | Admit: 2019-08-31 | Discharge: 2019-08-31 | Disposition: A | Discharge status: Home / Self Care | Payer: No Typology Code available for payment source | Source: Ambulatory Visit | Attending: Obstetrics & Gynecology | Admitting: Obstetrics & Gynecology

## 2019-08-31 LAB — SARS CORONAVIRUS 2 (TAT 6-24 HRS): SARS Coronavirus 2: NEGATIVE

## 2019-09-02 ENCOUNTER — Encounter (HOSPITAL_COMMUNITY): Payer: Self-pay

## 2019-09-02 ENCOUNTER — Inpatient Hospital Stay (HOSPITAL_COMMUNITY)
Admission: AD | Admit: 2019-09-02 | Discharge: 2019-09-04 | DRG: 807 | Disposition: A | Discharge status: Home / Self Care | Payer: No Typology Code available for payment source | Attending: Obstetrics & Gynecology | Admitting: Obstetrics & Gynecology

## 2019-09-02 ENCOUNTER — Inpatient Hospital Stay (HOSPITAL_COMMUNITY): Payer: No Typology Code available for payment source

## 2019-09-02 ENCOUNTER — Inpatient Hospital Stay (HOSPITAL_COMMUNITY): Payer: No Typology Code available for payment source | Admitting: Anesthesiology

## 2019-09-02 ENCOUNTER — Other Ambulatory Visit: Payer: Self-pay

## 2019-09-02 DIAGNOSIS — Z20828 Contact with and (suspected) exposure to other viral communicable diseases: Secondary | ICD-10-CM | POA: Diagnosis present

## 2019-09-02 DIAGNOSIS — Z3490 Encounter for supervision of normal pregnancy, unspecified, unspecified trimester: Secondary | ICD-10-CM

## 2019-09-02 DIAGNOSIS — Z3A39 39 weeks gestation of pregnancy: Secondary | ICD-10-CM | POA: Diagnosis not present

## 2019-09-02 DIAGNOSIS — O26893 Other specified pregnancy related conditions, third trimester: Secondary | ICD-10-CM | POA: Diagnosis present

## 2019-09-02 DIAGNOSIS — Z6791 Unspecified blood type, Rh negative: Secondary | ICD-10-CM

## 2019-09-02 LAB — CBC
HCT: 34.4 % — ABNORMAL LOW (ref 36.0–46.0)
Hemoglobin: 11.5 g/dL — ABNORMAL LOW (ref 12.0–15.0)
MCH: 30.4 pg (ref 26.0–34.0)
MCHC: 33.4 g/dL (ref 30.0–36.0)
MCV: 91 fL (ref 80.0–100.0)
Platelets: 210 10*3/uL (ref 150–400)
RBC: 3.78 MIL/uL — ABNORMAL LOW (ref 3.87–5.11)
RDW: 12.7 % (ref 11.5–15.5)
WBC: 10.5 10*3/uL (ref 4.0–10.5)
nRBC: 0 % (ref 0.0–0.2)

## 2019-09-02 LAB — TYPE AND SCREEN
ABO/RH(D): O NEG
Antibody Screen: NEGATIVE

## 2019-09-02 LAB — ABO/RH: ABO/RH(D): O NEG

## 2019-09-02 LAB — RPR: RPR Ser Ql: NONREACTIVE

## 2019-09-02 MED ORDER — EPHEDRINE 5 MG/ML INJ: 10.0000 mg | INTRAVENOUS | Status: DC | PRN

## 2019-09-02 MED ORDER — FENTANYL-BUPIVACAINE-NACL 0.5-0.125-0.9 MG/250ML-% EP SOLN
12.0000 mL/h | EPIDURAL | Status: DC | PRN
  Filled 2019-09-02: qty 250

## 2019-09-02 MED ORDER — ONDANSETRON HCL 4 MG/2ML IJ SOLN
4.0000 mg | Freq: Four times a day (QID) | INTRAMUSCULAR | Status: DC | PRN
  Administered 2019-09-02 (×2): 4 mg via INTRAVENOUS
  Filled 2019-09-02 (×2): qty 2

## 2019-09-02 MED ORDER — OXYCODONE-ACETAMINOPHEN 5-325 MG PO TABS: 2.0000 | ORAL_TABLET | ORAL | Status: DC | PRN

## 2019-09-02 MED ORDER — WITCH HAZEL-GLYCERIN EX PADS: 1.0000 "application " | MEDICATED_PAD | CUTANEOUS | Status: DC | PRN

## 2019-09-02 MED ORDER — ACETAMINOPHEN 325 MG PO TABS
650.0000 mg | ORAL_TABLET | ORAL | Status: DC | PRN
  Administered 2019-09-03 (×2): 650 mg via ORAL
  Filled 2019-09-02 (×2): qty 2

## 2019-09-02 MED ORDER — SODIUM CHLORIDE (PF) 0.9 % IJ SOLN
INTRAMUSCULAR | Status: DC | PRN
  Administered 2019-09-02: 12 mL/h via EPIDURAL

## 2019-09-02 MED ORDER — ACETAMINOPHEN 325 MG PO TABS: 650.0000 mg | ORAL_TABLET | ORAL | Status: DC | PRN

## 2019-09-02 MED ORDER — LIDOCAINE HCL (PF) 1 % IJ SOLN: 30.0000 mL | INTRAMUSCULAR | Status: DC | PRN

## 2019-09-02 MED ORDER — LIDOCAINE HCL (PF) 1 % IJ SOLN
INTRAMUSCULAR | Status: DC | PRN
  Administered 2019-09-02: 5 mL via EPIDURAL

## 2019-09-02 MED ORDER — TETANUS-DIPHTH-ACELL PERTUSSIS 5-2.5-18.5 LF-MCG/0.5 IM SUSP: 0.5000 mL | Freq: Once | INTRAMUSCULAR | Status: DC

## 2019-09-02 MED ORDER — MISOPROSTOL 25 MCG QUARTER TABLET
25.0000 ug | ORAL_TABLET | ORAL | Status: DC | PRN
  Administered 2019-09-02 (×2): 25 ug via VAGINAL
  Filled 2019-09-02 (×2): qty 1

## 2019-09-02 MED ORDER — OXYTOCIN 40 UNITS IN NORMAL SALINE INFUSION - SIMPLE MED
2.5000 [IU]/h | INTRAVENOUS | Status: DC
  Filled 2019-09-02: qty 1000

## 2019-09-02 MED ORDER — ZOLPIDEM TARTRATE 5 MG PO TABS
5.0000 mg | ORAL_TABLET | Freq: Every evening | ORAL | Status: DC | PRN
  Administered 2019-09-02: 5 mg via ORAL
  Filled 2019-09-02: qty 1

## 2019-09-02 MED ORDER — PRENATAL MULTIVITAMIN CH
1.0000 | ORAL_TABLET | Freq: Every day | ORAL | Status: DC
  Administered 2019-09-03: 1 via ORAL
  Filled 2019-09-02: qty 1

## 2019-09-02 MED ORDER — COCONUT OIL OIL: 1.0000 "application " | TOPICAL_OIL | Status: DC | PRN

## 2019-09-02 MED ORDER — ONDANSETRON HCL 4 MG PO TABS: 4.0000 mg | ORAL_TABLET | ORAL | Status: DC | PRN

## 2019-09-02 MED ORDER — DIPHENHYDRAMINE HCL 25 MG PO CAPS: 25.0000 mg | ORAL_CAPSULE | Freq: Four times a day (QID) | ORAL | Status: DC | PRN

## 2019-09-02 MED ORDER — PHENYLEPHRINE 40 MCG/ML (10ML) SYRINGE FOR IV PUSH (FOR BLOOD PRESSURE SUPPORT): 80.0000 ug | PREFILLED_SYRINGE | INTRAVENOUS | Status: DC | PRN

## 2019-09-02 MED ORDER — OXYTOCIN BOLUS FROM INFUSION
500.0000 mL | Freq: Once | INTRAVENOUS | Status: AC
  Administered 2019-09-02: 500 mL via INTRAVENOUS

## 2019-09-02 MED ORDER — SENNOSIDES-DOCUSATE SODIUM 8.6-50 MG PO TABS
2.0000 | ORAL_TABLET | ORAL | Status: DC
  Administered 2019-09-03 (×2): 2 via ORAL
  Filled 2019-09-02 (×3): qty 2

## 2019-09-02 MED ORDER — TERBUTALINE SULFATE 1 MG/ML IJ SOLN: 0.2500 mg | Freq: Once | INTRAMUSCULAR | Status: DC | PRN

## 2019-09-02 MED ORDER — LACTATED RINGERS IV SOLN: 500.0000 mL | INTRAVENOUS | Status: DC | PRN

## 2019-09-02 MED ORDER — FENTANYL CITRATE (PF) 100 MCG/2ML IJ SOLN
50.0000 ug | INTRAMUSCULAR | Status: DC | PRN
  Administered 2019-09-02: 100 ug via INTRAVENOUS
  Filled 2019-09-02: qty 2

## 2019-09-02 MED ORDER — SIMETHICONE 80 MG PO CHEW: 80.0000 mg | CHEWABLE_TABLET | ORAL | Status: DC | PRN

## 2019-09-02 MED ORDER — BENZOCAINE-MENTHOL 20-0.5 % EX AERO
1.0000 "application " | INHALATION_SPRAY | CUTANEOUS | Status: DC | PRN
  Administered 2019-09-03: 1 via TOPICAL
  Filled 2019-09-02: qty 56

## 2019-09-02 MED ORDER — IBUPROFEN 600 MG PO TABS
600.0000 mg | ORAL_TABLET | Freq: Four times a day (QID) | ORAL | Status: DC
  Administered 2019-09-03 – 2019-09-04 (×6): 600 mg via ORAL
  Filled 2019-09-02 (×6): qty 1

## 2019-09-02 MED ORDER — OXYCODONE-ACETAMINOPHEN 5-325 MG PO TABS: 1.0000 | ORAL_TABLET | ORAL | Status: DC | PRN

## 2019-09-02 MED ORDER — LACTATED RINGERS IV SOLN
INTRAVENOUS | Status: DC
  Administered 2019-09-02: 01:00:00 via INTRAVENOUS

## 2019-09-02 MED ORDER — SOD CITRATE-CITRIC ACID 500-334 MG/5ML PO SOLN
30.0000 mL | ORAL | Status: DC | PRN
  Administered 2019-09-02: 30 mL via ORAL
  Filled 2019-09-02: qty 30

## 2019-09-02 MED ORDER — OXYTOCIN 40 UNITS IN NORMAL SALINE INFUSION - SIMPLE MED
1.0000 m[IU]/min | INTRAVENOUS | Status: DC
  Administered 2019-09-02: 2 m[IU]/min via INTRAVENOUS

## 2019-09-02 MED ORDER — ONDANSETRON HCL 4 MG/2ML IJ SOLN: 4.0000 mg | INTRAMUSCULAR | Status: DC | PRN

## 2019-09-02 MED ORDER — DIBUCAINE (PERIANAL) 1 % EX OINT: 1.0000 "application " | TOPICAL_OINTMENT | CUTANEOUS | Status: DC | PRN

## 2019-09-02 MED ORDER — DIPHENHYDRAMINE HCL 50 MG/ML IJ SOLN: 12.5000 mg | INTRAMUSCULAR | Status: DC | PRN

## 2019-09-02 MED ORDER — ZOLPIDEM TARTRATE 5 MG PO TABS: 5.0000 mg | ORAL_TABLET | Freq: Every evening | ORAL | Status: DC | PRN

## 2019-09-02 MED ORDER — LACTATED RINGERS IV SOLN
500.0000 mL | Freq: Once | INTRAVENOUS | Status: AC
  Administered 2019-09-02: 1000 mL via INTRAVENOUS

## 2019-09-02 MED ORDER — OXYCODONE-ACETAMINOPHEN 5-325 MG PO TABS
1.0000 | ORAL_TABLET | ORAL | Status: DC | PRN
  Administered 2019-09-04: 1 via ORAL
  Filled 2019-09-02: qty 1

## 2019-09-02 NOTE — Anesthesia Preprocedure Evaluation (Addendum)
Anesthesia Evaluation  Patient identified by MRN, date of birth, ID band Patient awake    Reviewed: Allergy & Precautions, Patient's Chart, lab work & pertinent test results  Airway Mallampati: II       Dental no notable dental hx.    Pulmonary asthma ,    Pulmonary exam normal        Cardiovascular negative cardio ROS Normal cardiovascular exam     Neuro/Psych  Headaches,    GI/Hepatic negative GI ROS, Neg liver ROS,   Endo/Other  negative endocrine ROS  Renal/GU negative Renal ROS     Musculoskeletal negative musculoskeletal ROS (+)   Abdominal (+) + obese,   Peds  Hematology   Anesthesia Other Findings   Reproductive/Obstetrics (+) Pregnancy                            Anesthesia Physical Anesthesia Plan  ASA: III  Anesthesia Plan: Epidural   Post-op Pain Management:    Induction:   PONV Risk Score and Plan:   Airway Management Planned: Natural Airway  Additional Equipment: None  Intra-op Plan:   Post-operative Plan:   Informed Consent: I have reviewed the patients History and Physical, chart, labs and discussed the procedure including the risks, benefits and alternatives for the proposed anesthesia with the patient or authorized representative who has indicated his/her understanding and acceptance.       Plan Discussed with:   Anesthesia Plan Comments: (Lab Results      Component                Value               Date                      WBC                      10.5                09/02/2019                HGB                      11.5 (L)            09/02/2019                HCT                      34.4 (L)            09/02/2019                MCV                      91.0                09/02/2019                PLT                      210                 09/02/2019           )        Anesthesia Quick Evaluation

## 2019-09-02 NOTE — H&P (Signed)
Destiny Harris is a 18 y.o. female G1 at [redacted]w[redacted]d presenting for elective IOL.  Patient has had uncomplicated antepartum course.  Rh negative.  GBS negative.  Second dose of VMP at 0445.  Patient is feeling mild CTX.    OB History    Gravida  1   Para      Term      Preterm      AB      Living        SAB      TAB      Ectopic      Multiple      Live Births             Past Medical History:  Diagnosis Date  . Asthma   . Headache    Past Surgical History:  Procedure Laterality Date  . ADENOIDECTOMY    . TONSILLECTOMY     Family History: family history is not on file. Social History:  reports that she has never smoked. She has never used smokeless tobacco. She reports that she does not drink alcohol or use drugs.     Maternal Diabetes: No Genetic Screening: Normal Maternal Ultrasounds/Referrals: Normal Fetal Ultrasounds or other Referrals:  None Maternal Substance Abuse:  No Significant Maternal Medications:  None Significant Maternal Lab Results:  Group B Strep negative and Rh negative Other Comments:  None  ROS Maternal Medical History:  Fetal activity: Perceived fetal activity is normal.   Last perceived fetal movement was within the past hour.    Prenatal complications: no prenatal complications Prenatal Complications - Diabetes: none.    Dilation: 1.5 Effacement (%): 60 Station: -3 Exam by:: B McClam, RN  Blood pressure 104/62, pulse 80, temperature 98.7 F (37.1 C), temperature source Oral, resp. rate 18, height 4\' 11"  (1.499 m), weight 93.8 kg, last menstrual period 12/03/2018. Maternal Exam:  Uterine Assessment: Contraction strength is mild.  Contraction frequency is irregular.   Abdomen: Patient reports no abdominal tenderness. Fundal height is c/w dates.   Estimated fetal weight is 7#12.       Fetal Exam Fetal Monitor Review: Baseline rate: 140.  Variability: moderate (6-25 bpm).   Pattern: no decelerations and accelerations  present.    Fetal State Assessment: Category I - tracings are normal.     Physical Exam  Constitutional: She is oriented to person, place, and time. She appears well-developed and well-nourished.  GI: Soft. There is no rebound and no guarding.  Neurological: She is alert and oriented to person, place, and time.  Skin: Skin is warm and dry.  Psychiatric: She has a normal mood and affect. Her behavior is normal.    Prenatal labs: ABO, Rh: --/--/O NEG, O NEG Performed at [facility] [address] [place], [place] [address]  [phone]/09 0018) Antibody: NEG (12/09 0018) Rubella: Immune (06/17 0000) RPR: Nonreactive (06/17 0000)  HBsAg: Negative (06/17 0000)  HIV: Non-reactive (06/17 0000)  GBS: Negative/-- (11/13 0000)   Assessment/Plan: 18yo G1 at [redacted]w[redacted]d for elective IOL -Check CVX at 0845 to either AROM or start pitocin.   -CLEA when desired -Anticipate NSVD   Linda Hedges 09/02/2019, 7:46 AM

## 2019-09-02 NOTE — Progress Notes (Signed)
ZAMERIA VOGL is a 18 y.o. G1P0 at [redacted]w[redacted]d by ultrasound admitted for induction of labor due to Elective at term.  Subjective: Feeling mild CTX.  Active FM.  Objective: BP 126/74   Pulse 92   Temp 98.7 F (37.1 C) (Oral)   Resp 18   Ht 4\' 11"  (1.499 m)   Wt 93.8 kg   LMP 12/03/2018   BMI 41.75 kg/m  No intake/output data recorded. No intake/output data recorded.  FHT:  FHR: 135 bpm, variability: moderate,  accelerations:  Present,  decelerations:  Absent UC:   regular, every 2-3 minutes SVE:   Dilation: 5.5 Effacement (%): 60 Station: -3 Exam by:: Epifanio Lesches, rnc AROM for clear fluid  Labs: Lab Results  Component Value Date   WBC 10.5 09/02/2019   HGB 11.5 (L) 09/02/2019   HCT 34.4 (L) 09/02/2019   MCV 91.0 09/02/2019   PLT 210 09/02/2019    Assessment / Plan: Induction of labor due to maternal request,  progressing well on pitocin  Labor: Progressing normally Preeclampsia:  n/a Fetal Wellbeing:  Category I Pain Control:  Labor support without medications I/D:  n/a Anticipated MOD:  NSVD  Starlin Steib 09/02/2019, 1:51 PM

## 2019-09-02 NOTE — Anesthesia Procedure Notes (Signed)
Epidural Patient location during procedure: OB Start time: 09/02/2019 2:19 PM End time: 09/02/2019 2:25 PM  Staffing Anesthesiologist: Effie Berkshire, MD Performed: anesthesiologist   Preanesthetic Checklist Completed: patient identified, site marked, surgical consent, pre-op evaluation, timeout performed, IV checked, risks and benefits discussed and monitors and equipment checked  Epidural Patient position: sitting Prep: ChloraPrep Patient monitoring: heart rate, continuous pulse ox and blood pressure Approach: midline Location: L3-L4 Injection technique: LOR saline  Needle:  Needle type: Tuohy  Needle gauge: 17 G Needle length: 9 cm Catheter type: closed end flexible Catheter size: 20 Guage Test dose: negative and 1.5% lidocaine  Assessment Events: blood not aspirated, injection not painful, no injection resistance and no paresthesia  Additional Notes LOR @ 7.5  Patient identified. Risks/Benefits/Options discussed with patient including but not limited to bleeding, infection, nerve damage, paralysis, failed block, incomplete pain control, headache, blood pressure changes, nausea, vomiting, reactions to medications, itching and postpartum back pain. Confirmed with bedside nurse the patient's most recent platelet count. Confirmed with patient that they are not currently taking any anticoagulation, have any bleeding history or any family history of bleeding disorders. Patient expressed understanding and wished to proceed. All questions were answered. Sterile technique was used throughout the entire procedure. Please see nursing notes for vital signs. Test dose was given through epidural catheter and negative prior to continuing to dose epidural or start infusion. Warning signs of high block given to the patient including shortness of breath, tingling/numbness in hands, complete motor block, or any concerning symptoms with instructions to call for help. Patient was given instructions  on fall risk and not to get out of bed. All questions and concerns addressed with instructions to call with any issues or inadequate analgesia.    Reason for block:procedure for pain

## 2019-09-02 NOTE — Progress Notes (Signed)
FHT Cat I CVX 1-2/50/-3, Foley bulb placed Start pitocin 2/2  Linda Hedges, DO

## 2019-09-03 LAB — CBC
HCT: 26.6 % — ABNORMAL LOW (ref 36.0–46.0)
Hemoglobin: 8.9 g/dL — ABNORMAL LOW (ref 12.0–15.0)
MCH: 30.4 pg (ref 26.0–34.0)
MCHC: 33.5 g/dL (ref 30.0–36.0)
MCV: 90.8 fL (ref 80.0–100.0)
Platelets: 162 10*3/uL (ref 150–400)
RBC: 2.93 MIL/uL — ABNORMAL LOW (ref 3.87–5.11)
RDW: 12.8 % (ref 11.5–15.5)
WBC: 11.6 10*3/uL — ABNORMAL HIGH (ref 4.0–10.5)
nRBC: 0 % (ref 0.0–0.2)

## 2019-09-03 MED ORDER — RHO D IMMUNE GLOBULIN 1500 UNIT/2ML IJ SOSY
300.0000 ug | PREFILLED_SYRINGE | Freq: Once | INTRAMUSCULAR | Status: AC
  Administered 2019-09-03: 13:00:00 300 ug via INTRAVENOUS
  Filled 2019-09-03: qty 2

## 2019-09-03 NOTE — Progress Notes (Signed)
Post Partum Day 1 Subjective: no complaints, up ad lib, voiding and tolerating PO  Objective: Blood pressure 105/72, pulse 74, temperature 97.8 F (36.6 C), temperature source Oral, resp. rate 18, height 4\' 11"  (1.499 m), weight 93.8 kg, last menstrual period 12/03/2018, SpO2 100 %, unknown if currently breastfeeding.  Physical Exam:  General: alert, cooperative, appears stated age and no distress Lochia: appropriate Uterine Fundus: firm Incision: healing well DVT Evaluation: No evidence of DVT seen on physical exam.  Recent Labs    09/02/19 0018 09/03/19 0553  HGB 11.5* 8.9*  HCT 34.4* 26.6*    Assessment/Plan: Plan for discharge tomorrow  Bottlefeeding   LOS: 1 day   Destiny Harris 09/03/2019, 8:53 AM

## 2019-09-03 NOTE — Anesthesia Postprocedure Evaluation (Signed)
Anesthesia Post Note  Patient: Destiny Harris  Procedure(s) Performed: AN AD [facility]     Patient location during evaluation: Mother Baby Anesthesia Type: Epidural Level of consciousness: awake and alert Pain management: pain level controlled Vital Signs Assessment: post-procedure vital signs reviewed and stable Respiratory status: spontaneous breathing Cardiovascular status: stable Postop Assessment: no headache, adequate PO intake, no backache, patient able to bend at knees, able to ambulate, epidural receding and no apparent nausea or vomiting Anesthetic complications: no    Last Vitals:  Vitals:   09/03/19 0100 09/03/19 0510  BP: 113/79 105/72  Pulse: 97 74  Resp: 18 18  Temp: 36.7 C 36.6 C  SpO2: 98% 100%    Last Pain:  Vitals:   09/03/19 0510  TempSrc: Oral  PainSc: 0-No pain   Pain Goal:                   Ailene Ards

## 2019-09-04 LAB — RH IG WORKUP (INCLUDES ABO/RH)
ABO/RH(D): O NEG
Fetal Screen: NEGATIVE
Gestational Age(Wks): 39
Unit division: 0

## 2019-09-04 MED ORDER — ACETAMINOPHEN 325 MG PO TABS: 650.0000 mg | ORAL_TABLET | Freq: Four times a day (QID) | ORAL | 0 refills | Status: DC | PRN

## 2019-09-04 MED ORDER — IBUPROFEN 600 MG PO TABS: 600.0000 mg | ORAL_TABLET | Freq: Four times a day (QID) | ORAL | 0 refills | Status: DC | PRN

## 2019-09-04 MED ORDER — OXYCODONE HCL 5 MG PO TABS: 5.0000 mg | ORAL_TABLET | ORAL | 0 refills | Status: DC | PRN

## 2019-09-04 NOTE — Discharge Summary (Signed)
Obstetric Discharge Summary Reason for Admission: induction of labor Prenatal Procedures: none Intrapartum Procedures: spontaneous vaginal delivery Postpartum Procedures: none Complications-Operative and Postpartum: none Hemoglobin  Date Value Ref Range Status  09/03/2019 8.9 (L) 12.0 - 15.0 g/dL Final    Comment:    QUANTITY NOT SUFFICIENT TO REPEAT TEST   HCT  Date Value Ref Range Status  09/03/2019 26.6 (L) 36.0 - 46.0 % Final    Physical Exam:  General: alert, cooperative and no distress Lochia: appropriate Uterine Fundus: firm Incision: healing well DVT Evaluation: No evidence of DVT seen on physical exam.  Discharge Diagnoses: Term Pregnancy-delivered  Discharge Information: Date: 09/04/2019 Activity: pelvic rest Diet: routine Medications: PNV, Ibuprofen and oxycodone Condition: stable Instructions: refer to practice specific booklet Discharge to: home   Newborn Data: Live born female  Birth Weight: 6 lb 6.5 oz (2906 g) APGAR: 8, 9  Newborn Delivery   Birth date/time: 09/02/2019 21:37:00 Delivery type: Vaginal, Spontaneous      Home with mother.  Shon Millet II 09/04/2019, 8:01 AM

## 2019-10-16 MED FILL — medroxyPROGESTERone ACETATE: 150 | 90 days supply | Qty: 1 | Fill #0

## 2019-10-20 MED FILL — TOPIRAMATE 25 MG TABLET: 25 | 90 days supply | Qty: 90 | Fill #0

## 2019-10-20 MED FILL — ONDANSETRON HCL 4 MG TABLET: 4 | 30 days supply | Qty: 30 | Fill #0

## 2019-10-20 MED FILL — SUMATRIPTAN SUCC 50 MG TAB: 50 | 30 days supply | Qty: 9 | Fill #0

## 2019-11-17 MED FILL — SUMATRIPTAN SUCC 100 MG TAB: 100 | 30 days supply | Qty: 9 | Fill #0

## 2019-11-17 MED FILL — TOPIRAMATE 50 MG TABLET: 50 | 90 days supply | Qty: 90 | Fill #0

## 2019-12-30 MED FILL — SUMATRIPTAN SUCC 100 MG TAB: 100 | 30 days supply | Qty: 9 | Fill #0

## 2019-12-30 MED FILL — BROMPHENIR-PSEUDOEPHED-DM S: 30-2-10 | 23 days supply | Qty: 473 | Fill #0

## 2020-01-05 MED FILL — medroxyPROGESTERone ACETATE: 150 | 90 days supply | Qty: 1 | Fill #1

## 2020-02-12 MED FILL — PROCHLORPERAZINE 10 MG TAB: 10 | 20 days supply | Qty: 60 | Fill #0

## 2020-02-12 MED FILL — BACLOFEN 10 MG TABS: 10 | 15 days supply | Qty: 45 | Fill #0

## 2020-02-12 MED FILL — TOPIRAMATE 25 MG TABLET: 25 | 30 days supply | Qty: 120 | Fill #0

## 2020-04-06 DIAGNOSIS — G43009 Migraine without aura, not intractable, without status migrainosus: Secondary | ICD-10-CM | POA: Insufficient documentation

## 2020-04-06 MED FILL — PROMETHAZINE 25 MG TABLET: 25 | 20 days supply | Qty: 60 | Fill #0

## 2020-04-06 MED FILL — TOPIRAMATE 50 MG TABLET: 50 | 30 days supply | Qty: 120 | Fill #0

## 2020-04-08 MED FILL — MEDROXYPROGESTERONE ACETATE: 150 | 90 days supply | Qty: 1 | Fill #2

## 2020-06-27 MED FILL — MEDROXYPROGESTERONE ACETATE: 150 | 90 days supply | Qty: 1 | Fill #3

## 2020-07-12 ENCOUNTER — Other Ambulatory Visit (HOSPITAL_COMMUNITY): Payer: Self-pay | Admitting: Family Medicine

## 2020-07-12 MED FILL — SUMATRIPTAN SUCC 100 MG TAB: 100 | 30 days supply | Qty: 9 | Fill #0

## 2020-07-12 MED FILL — BACLOFEN 10 MG TABS: 10 | 15 days supply | Qty: 45 | Fill #0

## 2020-07-12 MED FILL — TOPIRAMATE 50 MG TABLET: 50 | 30 days supply | Qty: 120 | Fill #0

## 2020-07-12 MED FILL — PROMETHAZINE 25 MG TABLET: 25 | 20 days supply | Qty: 60 | Fill #0

## 2020-07-18 MED FILL — AIMOVIG 140 MG/ML SOAJ: 140 | 30 days supply | Qty: 1 | Fill #0

## 2020-08-09 MED FILL — AIMOVIG 140 MG/ML SOAJ: 140 | 30 days supply | Qty: 1 | Fill #1

## 2020-09-12 MED FILL — AIMOVIG 140 MG/ML SOAJ: 140 | 30 days supply | Qty: 1 | Fill #2

## 2020-09-26 ENCOUNTER — Other Ambulatory Visit (HOSPITAL_COMMUNITY): Payer: Self-pay | Admitting: Obstetrics and Gynecology

## 2020-09-26 DIAGNOSIS — Z3042 Encounter for surveillance of injectable contraceptive: Secondary | ICD-10-CM | POA: Diagnosis not present

## 2020-09-26 MED FILL — medroxyPROGESTERone ACETATE: 150 | 90 days supply | Qty: 1 | Fill #0

## 2020-09-28 DIAGNOSIS — Z113 Encounter for screening for infections with a predominantly sexual mode of transmission: Secondary | ICD-10-CM | POA: Diagnosis not present

## 2020-09-28 DIAGNOSIS — N930 Postcoital and contact bleeding: Secondary | ICD-10-CM | POA: Diagnosis not present

## 2020-10-24 MED FILL — AIMOVIG 140 MG/ML SOAJ: 140 | 30 days supply | Qty: 1 | Fill #3

## 2020-10-25 ENCOUNTER — Other Ambulatory Visit (HOSPITAL_COMMUNITY): Payer: Self-pay | Admitting: Family Medicine

## 2020-10-25 MED FILL — SUMATRIPTAN SUCCINATE 100 M: 100 | 30 days supply | Qty: 9 | Fill #0

## 2020-10-25 MED FILL — TOPIRAMATE 50 MG TABLET: 50 | 30 days supply | Qty: 120 | Fill #0

## 2020-10-25 MED FILL — HYDROXYZINE HCL 25 MG TABS: 25 | 20 days supply | Qty: 60 | Fill #0

## 2020-10-25 MED FILL — BACLOFEN 10 MG TABS: 10 | 15 days supply | Qty: 45 | Fill #0

## 2020-10-26 DIAGNOSIS — R059 Cough, unspecified: Secondary | ICD-10-CM | POA: Diagnosis not present

## 2020-10-26 DIAGNOSIS — R519 Headache, unspecified: Secondary | ICD-10-CM | POA: Diagnosis not present

## 2020-10-26 DIAGNOSIS — R0981 Nasal congestion: Secondary | ICD-10-CM | POA: Diagnosis not present

## 2020-10-26 DIAGNOSIS — Z1152 Encounter for screening for COVID-19: Secondary | ICD-10-CM | POA: Diagnosis not present

## 2020-10-26 DIAGNOSIS — N926 Irregular menstruation, unspecified: Secondary | ICD-10-CM | POA: Diagnosis not present

## 2020-11-10 ENCOUNTER — Other Ambulatory Visit (HOSPITAL_COMMUNITY): Payer: Self-pay | Admitting: Family Medicine

## 2020-11-10 DIAGNOSIS — O99345 Other mental disorders complicating the puerperium: Secondary | ICD-10-CM | POA: Diagnosis not present

## 2020-11-10 DIAGNOSIS — F419 Anxiety disorder, unspecified: Secondary | ICD-10-CM | POA: Diagnosis not present

## 2020-11-10 DIAGNOSIS — F53 Postpartum depression: Secondary | ICD-10-CM | POA: Diagnosis not present

## 2020-11-10 DIAGNOSIS — K5900 Constipation, unspecified: Secondary | ICD-10-CM | POA: Diagnosis not present

## 2020-11-10 MED FILL — FLUoxetine HCL 20 MG CAPS: 20 | 90 days supply | Qty: 90 | Fill #0

## 2020-11-15 ENCOUNTER — Other Ambulatory Visit (HOSPITAL_COMMUNITY): Payer: Self-pay | Admitting: Family Medicine

## 2020-11-15 DIAGNOSIS — G4700 Insomnia, unspecified: Secondary | ICD-10-CM | POA: Diagnosis not present

## 2020-11-15 DIAGNOSIS — G43709 Chronic migraine without aura, not intractable, without status migrainosus: Secondary | ICD-10-CM | POA: Diagnosis not present

## 2020-11-15 MED FILL — ATENOLOL 25 MG TABLET: 25 | 30 days supply | Qty: 30 | Fill #0

## 2020-11-15 MED FILL — RIZATRIPTAN BENZOATE 10 MG: 10 | 30 days supply | Qty: 9 | Fill #0

## 2020-11-15 MED FILL — CHLORZOXAZONE 500 MG TABLET: 500 | 15 days supply | Qty: 60 | Fill #0

## 2020-11-17 MED FILL — AIMOVIG 140 MG/ML SOAJ: 140 | 30 days supply | Qty: 1 | Fill #0

## 2020-11-24 DIAGNOSIS — Z6841 Body Mass Index (BMI) 40.0 and over, adult: Secondary | ICD-10-CM | POA: Diagnosis not present

## 2020-11-24 DIAGNOSIS — F5101 Primary insomnia: Secondary | ICD-10-CM | POA: Diagnosis not present

## 2020-11-24 DIAGNOSIS — G43009 Migraine without aura, not intractable, without status migrainosus: Secondary | ICD-10-CM | POA: Diagnosis not present

## 2020-11-24 DIAGNOSIS — G479 Sleep disorder, unspecified: Secondary | ICD-10-CM | POA: Diagnosis not present

## 2020-11-24 DIAGNOSIS — J4520 Mild intermittent asthma, uncomplicated: Secondary | ICD-10-CM | POA: Diagnosis not present

## 2020-12-13 ENCOUNTER — Other Ambulatory Visit (HOSPITAL_COMMUNITY): Payer: Self-pay | Admitting: Family Medicine

## 2020-12-13 MED FILL — ATENOLOL 25 MG TABLET: 25 | 30 days supply | Qty: 30 | Fill #0

## 2020-12-13 MED FILL — RIZATRIPTAN BENZOATE 10 MG: 10 | 16 days supply | Qty: 9 | Fill #0

## 2020-12-13 MED FILL — TOPIRAMATE 50 MG TABLET: 50 | 30 days supply | Qty: 120 | Fill #0

## 2020-12-13 MED FILL — CHLORZOXAZONE 500 MG TABLET: 500 | 15 days supply | Qty: 60 | Fill #0

## 2020-12-15 MED FILL — medroxyPROGESTERone ACETATE: 150 | 90 days supply | Qty: 1 | Fill #1

## 2020-12-16 DIAGNOSIS — Z3042 Encounter for surveillance of injectable contraceptive: Secondary | ICD-10-CM | POA: Diagnosis not present

## 2021-01-15 MED FILL — Rizatriptan Benzoate Tab 10 MG (Base Equivalent): ORAL | 30 days supply | Qty: 9 | Fill #0 | Status: AC

## 2021-01-15 MED FILL — Atenolol Tab 25 MG: ORAL | 30 days supply | Qty: 30 | Fill #0 | Status: AC

## 2021-01-16 ENCOUNTER — Other Ambulatory Visit (HOSPITAL_COMMUNITY): Payer: Self-pay

## 2021-01-16 DIAGNOSIS — J40 Bronchitis, not specified as acute or chronic: Secondary | ICD-10-CM | POA: Diagnosis not present

## 2021-01-16 MED FILL — Erenumab-aooe Subcutaneous Soln Auto-Injector 140 MG/ML: SUBCUTANEOUS | 30 days supply | Qty: 1 | Fill #0 | Status: AC

## 2021-01-17 ENCOUNTER — Other Ambulatory Visit (HOSPITAL_COMMUNITY): Payer: Self-pay

## 2021-01-18 DIAGNOSIS — J40 Bronchitis, not specified as acute or chronic: Secondary | ICD-10-CM | POA: Diagnosis not present

## 2021-02-08 ENCOUNTER — Other Ambulatory Visit (HOSPITAL_COMMUNITY): Payer: Self-pay

## 2021-02-23 ENCOUNTER — Other Ambulatory Visit (HOSPITAL_COMMUNITY): Payer: Self-pay

## 2021-02-23 MED FILL — Erenumab-aooe Subcutaneous Soln Auto-Injector 140 MG/ML: SUBCUTANEOUS | 30 days supply | Qty: 1 | Fill #0 | Status: CN

## 2021-02-23 MED FILL — Erenumab-aooe Subcutaneous Soln Auto-Injector 140 MG/ML: SUBCUTANEOUS | 28 days supply | Qty: 1 | Fill #0 | Status: CN

## 2021-02-24 ENCOUNTER — Other Ambulatory Visit (HOSPITAL_COMMUNITY): Payer: Self-pay

## 2021-03-20 ENCOUNTER — Other Ambulatory Visit (HOSPITAL_COMMUNITY): Payer: Self-pay

## 2021-03-29 ENCOUNTER — Other Ambulatory Visit (HOSPITAL_COMMUNITY): Payer: Self-pay

## 2021-04-06 ENCOUNTER — Other Ambulatory Visit (HOSPITAL_COMMUNITY): Payer: Self-pay

## 2022-12-07 ENCOUNTER — Ambulatory Visit (INDEPENDENT_AMBULATORY_CARE_PROVIDER_SITE_OTHER): Payer: BC Managed Care – PPO | Admitting: Advanced Practice Midwife

## 2022-12-07 ENCOUNTER — Encounter: Payer: Self-pay | Admitting: Advanced Practice Midwife

## 2022-12-07 VITALS — BP 109/72 | HR 95 | Ht 59.0 in | Wt 187.0 lb

## 2022-12-07 DIAGNOSIS — Z3202 Encounter for pregnancy test, result negative: Secondary | ICD-10-CM | POA: Diagnosis not present

## 2022-12-07 DIAGNOSIS — Z3009 Encounter for other general counseling and advice on contraception: Secondary | ICD-10-CM

## 2022-12-07 LAB — POCT URINE PREGNANCY: Preg Test, Ur: NEGATIVE

## 2022-12-07 MED ORDER — MEDROXYPROGESTERONE ACETATE 150 MG/ML IM SUSP: 150.0000 mg | INTRAMUSCULAR | 3 refills | Status: DC

## 2022-12-07 NOTE — Progress Notes (Signed)
   GYN VISIT Patient name: Destiny Harris MRN [id number]  Date of birth: 13-Oct-2000 Chief Complaint:   irregular period (Talk birth control. Not bleeding today also having cramping with period)  History of Present Illness:   Destiny Harris is a 22 y.o. G87P1001 Caucasian female being seen today for wanting to start on contraception; last 2 cycles have been 4 days in length but have been more painful that usual.     Patient's last menstrual period was 11/30/2022. The current method of family planning is none.  Last pap: when she was postpartum >28yrs ago. Results were:  neg per her report     12/07/2022    9:08 AM  Depression screen PHQ 2/9  Decreased Interest 0  Down, Depressed, Hopeless 0  PHQ - 2 Score 0  Altered sleeping 0  Tired, decreased energy 0  Change in appetite 0  Feeling bad or failure about yourself  0  Trouble concentrating 0  Moving slowly or fidgety/restless 0  Suicidal thoughts 0  PHQ-9 Score 0        12/07/2022    9:09 AM  GAD 7 : Generalized Anxiety Score  Nervous, Anxious, on Edge 0  Control/stop worrying 0  Worry too much - different things 0  Trouble relaxing 0  Restless 0  Easily annoyed or irritable 0  Afraid - awful might happen 0  Total GAD 7 Score 0     Review of Systems:   Pertinent items are noted in HPI Denies fever/chills, dizziness, headaches, visual disturbances, fatigue, shortness of breath, chest pain, abdominal pain, vomiting, abnormal vaginal discharge/itching/odor/irritation, problems with periods, bowel movements, urination, or intercourse unless otherwise stated above.  Pertinent History Reviewed:  Reviewed past medical,surgical, social, obstetrical and family history.  Reviewed problem list, medications and allergies. Physical Assessment:   Vitals:   12/07/22 0910  BP: 109/72  Pulse: 95  Weight: 187 lb (84.8 kg)  Height: 4\' 11"  (1.499 m)  Body mass index is 37.77 kg/m.       Physical Examination:   General  appearance: alert, well appearing, and in no distress  Mental status: alert, oriented to person, place, and time  Skin: warm & dry   Cardiovascular: normal heart rate noted  Respiratory: normal respiratory effort, no distress  Abdomen: soft, non-tender   Pelvic: examination not indicated  Extremities: no edema    Results for orders placed or performed in visit on 12/07/22 (from the past 24 hour(s))  POCT urine pregnancy   Collection Time: 12/07/22  9:27 AM  Result Value Ref Range   Preg Test, Ur Negative Negative    Assessment & Plan:  1) Contraception counseling> would like to use DMPA, which she has used in the past; had sex last night; will sched visit in 2wks (no sex) and will get Pap/physical with that visit  Meds:  Meds ordered this encounter  Medications   medroxyPROGESTERone (DEPO-PROVERA) 150 MG/ML injection    Sig: Inject 1 mL (150 mg total) into the muscle every 3 (three) months.    Dispense:  1 mL    Refill:  3    Order Specific Question:   Supervising Provider    Answer:   Janyth Pupa [id number]    Orders Placed This Encounter  Procedures   POCT urine pregnancy    Return in about 2 weeks (around 12/21/2022) for Pap & Physical (RN to give Depo).  Destiny Harris CNM 12/07/2022 9:39 AM

## 2022-12-17 ENCOUNTER — Telehealth: Payer: Self-pay | Admitting: Advanced Practice Midwife

## 2022-12-20 ENCOUNTER — Ambulatory Visit (INDEPENDENT_AMBULATORY_CARE_PROVIDER_SITE_OTHER): Payer: BC Managed Care – PPO | Admitting: *Deleted

## 2022-12-20 DIAGNOSIS — Z3042 Encounter for surveillance of injectable contraceptive: Secondary | ICD-10-CM

## 2022-12-20 DIAGNOSIS — Z3202 Encounter for pregnancy test, result negative: Secondary | ICD-10-CM

## 2022-12-20 LAB — POCT URINE PREGNANCY: Preg Test, Ur: NEGATIVE

## 2022-12-20 MED ORDER — MEDROXYPROGESTERONE ACETATE 150 MG/ML IM SUSY
150.0000 mg | PREFILLED_SYRINGE | Freq: Once | INTRAMUSCULAR | Status: AC
  Administered 2022-12-20: 150 mg via INTRAMUSCULAR

## 2022-12-20 NOTE — Progress Notes (Signed)
   NURSE VISIT- INJECTION  SUBJECTIVE:  Destiny Harris is a 22 y.o. G13P1001 female here for a Depo Provera for contraception/period management. She is a GYN patient.   OBJECTIVE:  LMP 12/19/2022   Appears well, in no apparent distress  Injection administered in: Right deltoid  Meds ordered this encounter  Medications   medroxyPROGESTERone Acetate SUSY 150 mg    ASSESSMENT: GYN patient Depo Provera for contraception/period management PLAN: Follow-up: in 11-13 weeks for next Depo   Alice Rieger  12/20/2022 2:40 PM

## 2023-02-05 ENCOUNTER — Other Ambulatory Visit (HOSPITAL_COMMUNITY)
Admission: RE | Admit: 2023-02-05 | Discharge: 2023-02-05 | Disposition: A | Discharge status: Home / Self Care | Payer: BC Managed Care – PPO | Source: Ambulatory Visit | Attending: Adult Health | Admitting: Adult Health

## 2023-02-05 ENCOUNTER — Encounter: Payer: Self-pay | Admitting: Adult Health

## 2023-02-05 ENCOUNTER — Ambulatory Visit (INDEPENDENT_AMBULATORY_CARE_PROVIDER_SITE_OTHER): Payer: BC Managed Care – PPO | Admitting: Adult Health

## 2023-02-05 VITALS — BP 115/77 | HR 91 | Ht 59.0 in | Wt 197.0 lb

## 2023-02-05 DIAGNOSIS — Z01419 Encounter for gynecological examination (general) (routine) without abnormal findings: Secondary | ICD-10-CM | POA: Insufficient documentation

## 2023-02-05 DIAGNOSIS — Z3042 Encounter for surveillance of injectable contraceptive: Secondary | ICD-10-CM | POA: Insufficient documentation

## 2023-02-05 DIAGNOSIS — Z0000 Encounter for general adult medical examination without abnormal findings: Secondary | ICD-10-CM | POA: Insufficient documentation

## 2023-02-05 NOTE — Progress Notes (Signed)
Patient ID: Destiny Harris, female   DOB: 12-Apr-2001, 22 y.o.   MRN: [id number] History of Present Illness: Destiny Harris is a 22 year old white female,with SO, G1P1001 in for a well woman gyn exam and pap. 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  PCP is Dr Cyndia Bent.   Current Medications, Allergies, Past Medical History, Past Surgical History, Family History and Social History were reviewed in [employer] record.     Review of Systems: Patient denies any headaches, hearing loss, fatigue, blurred vision, shortness of breath, chest pain, abdominal pain, problems with bowel movements, urination, or intercourse. No joint pain or mood swings.     Physical Exam:BP 115/77 (BP Location: Left Arm, Patient Position: Sitting, Cuff Size: Normal)   Pulse 91   Ht 4\' 11"  (1.499 m)   Wt 197 lb (89.4 kg)   Breastfeeding No   BMI 39.79 kg/m   General:  Well developed, well nourished, no acute distress Skin:  Warm and dry Neck:  Midline trachea, normal thyroid, good ROM, no lymphadenopathy Lungs; Clear to auscultation bilaterally Breast:  No dominant palpable mass, retraction, or nipple discharge Cardiovascular: Regular rate and rhythm Abdomen:  Soft, non tender, no hepatosplenomegaly Pelvic:  External genitalia is normal in appearance, no lesions.  The vagina is normal in appearance. Urethra has no lesions or masses. The cervix is bulbous. Pap with GC/CHL performed. Uterus is felt to be normal size, shape, and contour.  No adnexal masses or tenderness noted.Bladder is non tender, no masses felt. Extremities/musculoskeletal:  No swelling or varicosities noted, no clubbing or cyanosis Psych:  No mood changes, alert and cooperative,seems happy AA is 2 Fall risk is low    02/05/2023    2:20 PM 12/07/2022    9:08 AM  Depression screen PHQ 2/9  Decreased Interest 0 0  Down, Depressed, Hopeless 0 0  PHQ - 2 Score 0 0  Altered sleeping 0 0  Tired, decreased energy 0 0  Change in appetite 0 0  Feeling bad or failure  about yourself  0 0  Trouble concentrating 0 0  Moving slowly or fidgety/restless 0 0  Suicidal thoughts 0 0  PHQ-9 Score 0 0       02/05/2023    2:20 PM 12/07/2022    9:09 AM  GAD 7 : Generalized Anxiety Score  Nervous, Anxious, on Edge 0 0  Control/stop worrying 0 0  Worry too much - different things 0 0  Trouble relaxing 0 0  Restless 0 0  Easily annoyed or irritable 0 0  Afraid - awful might happen 0 0  Total GAD 7 Score 0 0      Upstream - 02/05/23 1428       Pregnancy Intention Screening   Does the patient want to become pregnant in the next year? No    Does the patient's partner want to become pregnant in the next year? No    Would the patient like to discuss contraceptive options today? No      Contraception Wrap Up   Current Method Hormonal Injection    End Method Hormonal Injection            Examination chaperoned by Malachy Mood LPN   Impression and Plan: 1. Encounter for gynecological examination with Papanicolaou smear of cervix Pap sent Pap in 3 years if normal Physical in 1 year - Cytology - PAP( [facility])  2. Routine general medical examination at a health care facility Pap sent - Cytology - PAP( [facility])  3. Encounter for surveillance of injectable contraceptive Happy with depo, has refills

## 2023-02-07 LAB — CYTOLOGY - PAP
Chlamydia: NEGATIVE
Comment: NEGATIVE
Comment: NORMAL
Diagnosis: NEGATIVE
Neisseria Gonorrhea: NEGATIVE

## 2023-02-26 ENCOUNTER — Ambulatory Visit: Payer: BC Managed Care – PPO | Admitting: Adult Health

## 2023-03-13 ENCOUNTER — Ambulatory Visit (INDEPENDENT_AMBULATORY_CARE_PROVIDER_SITE_OTHER): Payer: BC Managed Care – PPO | Admitting: *Deleted

## 2023-03-13 ENCOUNTER — Ambulatory Visit: Payer: BC Managed Care – PPO

## 2023-03-13 DIAGNOSIS — Z3042 Encounter for surveillance of injectable contraceptive: Secondary | ICD-10-CM | POA: Diagnosis not present

## 2023-03-13 MED ORDER — MEDROXYPROGESTERONE ACETATE 150 MG/ML IM SUSY
150.0000 mg | PREFILLED_SYRINGE | Freq: Once | INTRAMUSCULAR | Status: AC
Start: 2023-03-13 — End: 2023-03-13
  Administered 2023-03-13: 150 mg via INTRAMUSCULAR

## 2023-03-13 NOTE — Progress Notes (Signed)
   NURSE VISIT- INJECTION  SUBJECTIVE:  Destiny Harris is a 22 y.o. G45P1001 female here for a Depo Provera for contraception/period management. She is a GYN patient.   OBJECTIVE:  There were no vitals taken for this visit.  Appears well, in no apparent distress  Injection administered in: Left deltoid  Meds ordered this encounter  Medications   medroxyPROGESTERone Acetate SUSY 150 mg    ASSESSMENT: GYN patient Depo Provera for contraception/period management PLAN: Follow-up: in 11-13 weeks for next Depo   Jobe Marker  03/13/2023 3:18 PM

## 2023-03-14 ENCOUNTER — Ambulatory Visit: Payer: BC Managed Care – PPO

## 2023-04-10 ENCOUNTER — Ambulatory Visit (HOSPITAL_COMMUNITY)
Admission: EM | Admit: 2023-04-10 | Discharge: 2023-04-10 | Disposition: A | Discharge status: Home / Self Care | Payer: No Typology Code available for payment source | Source: Ambulatory Visit | Attending: Emergency Medicine | Admitting: Emergency Medicine

## 2023-04-10 ENCOUNTER — Other Ambulatory Visit: Payer: Self-pay

## 2023-04-10 ENCOUNTER — Encounter (HOSPITAL_COMMUNITY): Payer: Self-pay | Admitting: Emergency Medicine

## 2023-04-10 ENCOUNTER — Emergency Department (HOSPITAL_COMMUNITY)
Admission: EM | Admit: 2023-04-10 | Discharge: 2023-04-10 | Disposition: A | Discharge status: Home / Self Care | Payer: BC Managed Care – PPO | Attending: Emergency Medicine | Admitting: Emergency Medicine

## 2023-04-10 DIAGNOSIS — Z0441 Encounter for examination and observation following alleged adult rape: Secondary | ICD-10-CM | POA: Diagnosis present

## 2023-04-10 DIAGNOSIS — T7421XA Adult sexual abuse, confirmed, initial encounter: Secondary | ICD-10-CM | POA: Diagnosis not present

## 2023-04-10 LAB — URINALYSIS, ROUTINE W REFLEX MICROSCOPIC
Bilirubin Urine: NEGATIVE
Glucose, UA: NEGATIVE mg/dL
Hgb urine dipstick: NEGATIVE
Ketones, ur: NEGATIVE mg/dL
Leukocytes,Ua: NEGATIVE
Nitrite: NEGATIVE
Protein, ur: NEGATIVE mg/dL
Specific Gravity, Urine: 1.016 (ref 1.005–1.030)
pH: 7 (ref 5.0–8.0)

## 2023-04-10 LAB — PREGNANCY, URINE: Preg Test, Ur: NEGATIVE

## 2023-04-10 MED ORDER — LIDOCAINE HCL (PF) 1 % IJ SOLN
1.0000 mL | Freq: Once | INTRAMUSCULAR | Status: AC
  Administered 2023-04-10: 2 mL

## 2023-04-10 MED ORDER — IBUPROFEN 400 MG PO TABS
400.0000 mg | ORAL_TABLET | Freq: Once | ORAL | Status: AC
  Administered 2023-04-10: 400 mg via ORAL
  Filled 2023-04-10: qty 1

## 2023-04-10 MED ORDER — OXYCODONE-ACETAMINOPHEN 5-325 MG PO TABS: 1.0000 | ORAL_TABLET | Freq: Once | ORAL | Status: DC | PRN

## 2023-04-10 MED ORDER — ONDANSETRON 4 MG PO TBDP
4.0000 mg | ORAL_TABLET | Freq: Once | ORAL | Status: AC
  Administered 2023-04-10: 4 mg via ORAL
  Filled 2023-04-10: qty 1

## 2023-04-10 MED ORDER — METRONIDAZOLE 500 MG PO TABS
2000.0000 mg | ORAL_TABLET | Freq: Once | ORAL | Status: AC
  Administered 2023-04-10: 2000 mg via ORAL

## 2023-04-10 MED ORDER — AZITHROMYCIN 250 MG PO TABS
1000.0000 mg | ORAL_TABLET | Freq: Once | ORAL | Status: AC
  Administered 2023-04-10: 1000 mg via ORAL

## 2023-04-10 MED ORDER — CEFTRIAXONE SODIUM 500 MG IJ SOLR
500.0000 mg | Freq: Once | INTRAMUSCULAR | Status: AC
  Administered 2023-04-10: 500 mg via INTRAMUSCULAR
  Filled 2023-04-10: qty 500

## 2023-04-10 MED ORDER — CEFADROXIL 500 MG PO CAPS: 500.0000 mg | ORAL_CAPSULE | Freq: Two times a day (BID) | ORAL | 0 refills | Status: DC

## 2023-04-10 MED ORDER — PROMETHAZINE HCL 12.5 MG PO TABS
25.0000 mg | ORAL_TABLET | Freq: Four times a day (QID) | ORAL | Status: DC | PRN
  Administered 2023-04-10: 25 mg via ORAL

## 2023-04-10 MED ORDER — IBUPROFEN 800 MG PO TABS: 800.0000 mg | ORAL_TABLET | Freq: Three times a day (TID) | ORAL | 0 refills | Status: DC

## 2023-04-10 NOTE — Discharge Instructions (Addendum)
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 garbage or flushed down the toilet. If you are not sure, ask your care team. If it is safe to put it in the trash, take the medication out of the container. Mix the medication with cat litter, dirt, coffee grounds, or other unwanted substance. Seal the mixture in a bag or container. Put it in the trash. NOTE: This sheet is a summary. It may not cover all possible information. If you have questions about this medicine, talk to your doctor, pharmacist, or health care provider.  2022 Elsevier/Gold Standard (2020-11-03 13:29:17)
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
## 2023-04-10 NOTE — ED Notes (Addendum)
Sane nurse in the room with pt

## 2023-04-10 NOTE — ED Notes (Signed)
Provided pt with urine specimen cup for urine collection; specimen collected and is at bedside awaiting orders. RN also provided pt with bags to collect used toilet tissue while awaiting SANE examination.
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
## 2023-04-10 NOTE — ED Notes (Signed)
Pt verbalized her private area burns and hurts just to sit on the bed and it hurt and burned throughout the night.

## 2023-04-12 LAB — URINE CULTURE: Culture: >=100000 — AB
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
## 2023-04-22 ENCOUNTER — Other Ambulatory Visit (HOSPITAL_COMMUNITY)
Admission: RE | Admit: 2023-04-22 | Discharge: 2023-04-22 | Disposition: A | Discharge status: Home / Self Care | Payer: BC Managed Care – PPO | Source: Ambulatory Visit | Attending: Obstetrics & Gynecology | Admitting: Obstetrics & Gynecology

## 2023-04-22 ENCOUNTER — Ambulatory Visit (INDEPENDENT_AMBULATORY_CARE_PROVIDER_SITE_OTHER): Payer: BC Managed Care – PPO | Admitting: Family Medicine

## 2023-04-22 ENCOUNTER — Encounter: Payer: Self-pay | Admitting: Family Medicine

## 2023-04-22 VITALS — BP 123/87 | HR 107 | Ht 59.0 in | Wt 191.0 lb

## 2023-04-22 DIAGNOSIS — R350 Frequency of micturition: Secondary | ICD-10-CM

## 2023-04-22 DIAGNOSIS — M545 Low back pain, unspecified: Secondary | ICD-10-CM | POA: Diagnosis not present

## 2023-04-22 DIAGNOSIS — Z113 Encounter for screening for infections with a predominantly sexual mode of transmission: Secondary | ICD-10-CM | POA: Diagnosis not present

## 2023-04-22 DIAGNOSIS — T7421XD Adult sexual abuse, confirmed, subsequent encounter: Secondary | ICD-10-CM | POA: Diagnosis not present

## 2023-04-22 LAB — POCT URINALYSIS DIPSTICK
Glucose, UA: NEGATIVE
Ketones, UA: NEGATIVE
Nitrite, UA: NEGATIVE
Protein, UA: NEGATIVE

## 2023-04-22 MED ORDER — CYCLOBENZAPRINE HCL 10 MG PO TABS: 10.0000 mg | ORAL_TABLET | Freq: Three times a day (TID) | ORAL | 0 refills | Status: DC | PRN
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
## 2023-04-24 ENCOUNTER — Other Ambulatory Visit: Payer: Self-pay | Admitting: Obstetrics & Gynecology

## 2023-04-24 MED ORDER — FLUCONAZOLE 150 MG PO TABS: ORAL_TABLET | ORAL | 1 refills | Status: DC

## 2023-06-05 ENCOUNTER — Ambulatory Visit: Payer: BC Managed Care – PPO

## 2023-09-11 ENCOUNTER — Ambulatory Visit (INDEPENDENT_AMBULATORY_CARE_PROVIDER_SITE_OTHER): Payer: BC Managed Care – PPO | Admitting: Advanced Practice Midwife

## 2023-09-11 ENCOUNTER — Encounter: Payer: Self-pay | Admitting: Advanced Practice Midwife

## 2023-09-11 VITALS — BP 105/74 | HR 81 | Ht 59.0 in | Wt 197.0 lb

## 2023-09-11 DIAGNOSIS — Z30011 Encounter for initial prescription of contraceptive pills: Secondary | ICD-10-CM

## 2023-09-11 DIAGNOSIS — Z3202 Encounter for pregnancy test, result negative: Secondary | ICD-10-CM

## 2023-09-11 LAB — POCT URINE PREGNANCY: Preg Test, Ur: NEGATIVE

## 2023-09-11 MED ORDER — NORETHIN ACE-ETH ESTRAD-FE 1-20 MG-MCG PO TABS: 1.0000 | ORAL_TABLET | Freq: Every day | ORAL | 11 refills | Status: AC
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
## 2023-12-10 ENCOUNTER — Encounter: Payer: Self-pay | Admitting: Adult Health

## 2023-12-10 ENCOUNTER — Ambulatory Visit: Admitting: Adult Health

## 2023-12-10 VITALS — BP 134/77 | HR 98 | Ht 59.0 in | Wt 190.0 lb

## 2023-12-10 DIAGNOSIS — Z3202 Encounter for pregnancy test, result negative: Secondary | ICD-10-CM | POA: Diagnosis not present

## 2023-12-10 DIAGNOSIS — N946 Dysmenorrhea, unspecified: Secondary | ICD-10-CM

## 2023-12-10 DIAGNOSIS — N921 Excessive and frequent menstruation with irregular cycle: Secondary | ICD-10-CM | POA: Diagnosis not present

## 2023-12-10 LAB — POCT URINE PREGNANCY: Preg Test, Ur: NEGATIVE

## 2023-12-10 NOTE — Progress Notes (Signed)
  Subjective:     Patient ID: Destiny Harris, female   DOB: 2001-02-20, 23 y.o.   MRN: [id number]  HPI Destiny Harris is a 22 white female,single, G1P1001 in complaining of heavier periods may change tampon every 30 minutes during the day and pads 2 x at night, has period cramps. Last period lasted 10 days, and had clots. She started junel 1/20 in December 2024 and does forget occasionally.     Component Value Date/Time   DIAGPAP  02/05/2023 1429    - Negative for intraepithelial lesion or malignancy (NILM)   ADEQPAP  02/05/2023 1429    Satisfactory for evaluation; transformation zone component PRESENT.    PCP is Dr Cyndia Bent.   Review of Systems Heavier periods  +cramps Had pain with sex when on period, but not when not on period Reviewed past medical,surgical, social and family history. Reviewed medications and allergies.     Objective:   Physical Exam BP 134/77 (BP Location: Left Arm, Patient Position: Sitting, Cuff Size: Normal)   Pulse 98   Ht 4\' 11"  (1.499 m)   Wt 190 lb (86.2 kg)   LMP 11/27/2023 (Exact Date)   BMI 38.38 kg/m  UPT is negative Skin warm and dry.  Lungs: clear to ausculation bilaterally. Cardiovascular: regular rate and rhythm.    Fall risk is moderate  Upstream - 12/10/23 1517       Pregnancy Intention Screening   Does the patient want to become pregnant in the next year? Yes    Does the patient's partner want to become pregnant in the next year? Yes    Would the patient like to discuss contraceptive options today? No      Contraception Wrap Up   Current Method Oral Contraceptive    End Method Oral Contraceptive    Contraception Counseling Provided Yes             Assessment:     1. Pregnancy examination or test, negative result - POCT urine pregnancy  2. Menorrhagia with irregular cycle (Primary) Periods heavier since January Will get pelvic US 12/19/23 at 3 pm in office to assess uterus and ovaries  - US PELVIC COMPLETE WITH TRANSVAGINAL;  Future Continue BCP  3. Menstrual cramps +cramps - US PELVIC COMPLETE WITH TRANSVAGINAL; Future     Plan:     Pelvic US 12/19/23 in office

## 2023-12-19 ENCOUNTER — Other Ambulatory Visit: Admitting: Radiology

## 2023-12-24 ENCOUNTER — Ambulatory Visit: Admitting: Radiology

## 2023-12-24 DIAGNOSIS — N921 Excessive and frequent menstruation with irregular cycle: Secondary | ICD-10-CM

## 2023-12-24 DIAGNOSIS — N946 Dysmenorrhea, unspecified: Secondary | ICD-10-CM

## 2023-12-24 NOTE — Progress Notes (Signed)
 GYN [place]: TA and TV imaging performed - Chaperone: Quila - Vinyl probe cover used Anteverted uterus, normal in size without focal mass, symmetrical homogeneous myometrium Endometrial thickness = 2.6 mm  thin avascular lining no evidence of intracavitary defects seen Normal right ovary, mobile  -  Left ovary seen with simple unilocular avascular echofree cyst measuring 54 x 38 x 40 mm.  Appears physiologic,  left ovary is mobile Neg adnexal regions -  neg CDS - no free fluid present

## 2024-01-21 ENCOUNTER — Encounter

## 2024-03-03 ENCOUNTER — Ambulatory Visit: Admitting: Adult Health
